# Patient Record
Sex: Female | Born: 2000 | Race: White | Hispanic: No | Marital: Single | Smoking: Former smoker
Health system: Southern US, Community
[De-identification: ages and names within clinical notes are randomized; demographics above are authoritative.]

## PROBLEM LIST (undated history)

## (undated) DIAGNOSIS — H9201 Otalgia, right ear: Secondary | ICD-10-CM

## (undated) DIAGNOSIS — Z872 Personal history of diseases of the skin and subcutaneous tissue: Secondary | ICD-10-CM

## (undated) DIAGNOSIS — Z8669 Personal history of other diseases of the nervous system and sense organs: Secondary | ICD-10-CM

## (undated) DIAGNOSIS — L2089 Other atopic dermatitis: Secondary | ICD-10-CM

## (undated) DIAGNOSIS — J45990 Exercise induced bronchospasm: Secondary | ICD-10-CM

## (undated) DIAGNOSIS — F332 Major depressive disorder, recurrent severe without psychotic features: Secondary | ICD-10-CM

## (undated) DIAGNOSIS — F938 Other childhood emotional disorders: Secondary | ICD-10-CM

## (undated) DIAGNOSIS — G479 Sleep disorder, unspecified: Secondary | ICD-10-CM

## (undated) DIAGNOSIS — E669 Obesity, unspecified: Secondary | ICD-10-CM

## (undated) DIAGNOSIS — R569 Unspecified convulsions: Secondary | ICD-10-CM

## (undated) DIAGNOSIS — J3081 Allergic rhinitis due to animal (cat) (dog) hair and dander: Secondary | ICD-10-CM

## (undated) DIAGNOSIS — F325 Major depressive disorder, single episode, in full remission: Secondary | ICD-10-CM

## (undated) DIAGNOSIS — Z30017 Encounter for initial prescription of implantable subdermal contraceptive: Secondary | ICD-10-CM

## (undated) DIAGNOSIS — Z91010 Allergy to peanuts: Secondary | ICD-10-CM

## (undated) DIAGNOSIS — Z8659 Personal history of other mental and behavioral disorders: Secondary | ICD-10-CM

## (undated) DIAGNOSIS — G40309 Generalized idiopathic epilepsy and epileptic syndromes, not intractable, without status epilepticus: Secondary | ICD-10-CM

## (undated) DIAGNOSIS — F17200 Nicotine dependence, unspecified, uncomplicated: Secondary | ICD-10-CM

## (undated) DIAGNOSIS — Z9109 Other allergy status, other than to drugs and biological substances: Secondary | ICD-10-CM

## (undated) DIAGNOSIS — K5900 Constipation, unspecified: Secondary | ICD-10-CM

## (undated) DIAGNOSIS — R39198 Other difficulties with micturition: Secondary | ICD-10-CM

## (undated) DIAGNOSIS — M41129 Adolescent idiopathic scoliosis, site unspecified: Secondary | ICD-10-CM

## (undated) DIAGNOSIS — F801 Expressive language disorder: Secondary | ICD-10-CM

## (undated) DIAGNOSIS — M549 Dorsalgia, unspecified: Secondary | ICD-10-CM

## (undated) DIAGNOSIS — J4530 Mild persistent asthma, uncomplicated: Secondary | ICD-10-CM

## (undated) DIAGNOSIS — Z91048 Other nonmedicinal substance allergy status: Secondary | ICD-10-CM

## (undated) DIAGNOSIS — L309 Dermatitis, unspecified: Secondary | ICD-10-CM

## (undated) DIAGNOSIS — L700 Acne vulgaris: Secondary | ICD-10-CM

## (undated) DIAGNOSIS — R55 Syncope and collapse: Secondary | ICD-10-CM

## (undated) DIAGNOSIS — J45909 Unspecified asthma, uncomplicated: Secondary | ICD-10-CM

## (undated) HISTORY — DX: Syncope and collapse: R55

## (undated) HISTORY — PX: NO PAST SURGERIES: SHX2092

## (undated) HISTORY — DX: Acne vulgaris: L70.0

## (undated) HISTORY — DX: Sleep disorder, unspecified: G47.9

## (undated) HISTORY — DX: Personal history of other mental and behavioral disorders: Z86.59

## (undated) HISTORY — DX: Nicotine dependence, unspecified, uncomplicated: F17.200

## (undated) HISTORY — DX: Allergic rhinitis due to animal (cat) (dog) hair and dander: J30.81

## (undated) HISTORY — DX: Other atopic dermatitis: L20.89

## (undated) HISTORY — DX: Other nonmedicinal substance allergy status: Z91.048

## (undated) HISTORY — DX: Major depressive disorder, recurrent severe without psychotic features: F33.2

## (undated) HISTORY — DX: Expressive language disorder: F80.1

## (undated) HISTORY — DX: Other difficulties with micturition: R39.198

## (undated) HISTORY — DX: Personal history of other diseases of the nervous system and sense organs: Z86.69

## (undated) HISTORY — DX: Dermatitis, unspecified: L30.9

## (undated) HISTORY — DX: Obesity, unspecified: E66.9

## (undated) HISTORY — DX: Adolescent idiopathic scoliosis, site unspecified: M41.129

## (undated) HISTORY — DX: Dorsalgia, unspecified: M54.9

## (undated) HISTORY — DX: Encounter for initial prescription of implantable subdermal contraceptive: Z30.017

## (undated) HISTORY — DX: Otalgia, right ear: H92.01

## (undated) HISTORY — DX: Exercise induced bronchospasm: J45.990

## (undated) HISTORY — DX: Constipation, unspecified: K59.00

## (undated) HISTORY — DX: Personal history of diseases of the skin and subcutaneous tissue: Z87.2

## (undated) HISTORY — DX: Unspecified asthma, uncomplicated: J45.909

## (undated) HISTORY — DX: Generalized idiopathic epilepsy and epileptic syndromes, not intractable, without status epilepticus: G40.309

## (undated) HISTORY — DX: Major depressive disorder, single episode, in full remission: F32.5

## (undated) HISTORY — DX: Mild persistent asthma, uncomplicated: J45.30

## (undated) HISTORY — DX: Unspecified convulsions: R56.9

## (undated) HISTORY — DX: Allergy to peanuts: Z91.010

## (undated) HISTORY — DX: Other allergy status, other than to drugs and biological substances: Z91.09

---
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
## 2019-03-11 ENCOUNTER — Other Ambulatory Visit: Payer: Self-pay

## 2019-03-11 ENCOUNTER — Ambulatory Visit (INDEPENDENT_AMBULATORY_CARE_PROVIDER_SITE_OTHER): Payer: Medicaid Other | Admitting: *Deleted

## 2019-03-11 DIAGNOSIS — Z23 Encounter for immunization: Secondary | ICD-10-CM

## 2019-03-15 ENCOUNTER — Encounter: Payer: Self-pay | Admitting: Family Medicine

## 2019-03-15 DIAGNOSIS — Z87891 Personal history of nicotine dependence: Secondary | ICD-10-CM | POA: Insufficient documentation

## 2019-03-16 ENCOUNTER — Encounter: Payer: Self-pay | Admitting: Family Medicine

## 2019-03-16 ENCOUNTER — Other Ambulatory Visit: Payer: Self-pay

## 2019-03-16 ENCOUNTER — Ambulatory Visit (INDEPENDENT_AMBULATORY_CARE_PROVIDER_SITE_OTHER): Payer: Medicaid Other | Admitting: Family Medicine

## 2019-03-16 VITALS — BP 120/80 | HR 55 | Wt 269.8 lb

## 2019-03-16 DIAGNOSIS — N946 Dysmenorrhea, unspecified: Secondary | ICD-10-CM

## 2019-03-16 DIAGNOSIS — R1110 Vomiting, unspecified: Secondary | ICD-10-CM | POA: Diagnosis not present

## 2019-03-16 DIAGNOSIS — N926 Irregular menstruation, unspecified: Secondary | ICD-10-CM | POA: Diagnosis not present

## 2019-03-16 LAB — POCT GLYCOSYLATED HEMOGLOBIN (HGB A1C): Hemoglobin A1C: 5.5 % (ref 4.0–5.6)

## 2019-03-16 LAB — POCT URINE PREGNANCY: Preg Test, Ur: NEGATIVE

## 2019-03-16 NOTE — Assessment & Plan Note (Signed)
U preg negative in the clinic today.  Suspect onset of vaginal bleeding with menstrual cramps earlier today is the start to her cycle with no additional red flags. Stopped Depo injections in 06/2019, may continue to see irregularity in her cycles up to 1 year. - Ibuprofen 400-600 mg every 4-6 hours for pain, bleeding - Discussed alternative contraception methods (not on anything currently), highly encouraged patient to consider her options and discuss further on follow-up - Recommended taking prenatal vitamins during this time - Return precautions discussed including persistent vaginal bleeding >1 week, lightheadedness/dizziness, severe abdominal pain etc.

## 2019-03-16 NOTE — Assessment & Plan Note (Addendum)
Incidentally noted during conversation. Reports > 3 month history of vomiting small amounts of yellow fluid every morning upon awakening, patient has attributed this to her IBS. Otherwise feels well.  Differential remains broad, such as gastroparesis, GERD, functional vomiting disorder, etc. Less likely cyclic vomiting given everyday nature and no history of MJ use. Recommend follow-up with her PCP to discuss this further. - Screening A1c obtained to rule out diabetic contribution given elevated BMI, 5.5 in office - Follow-up with PCP within the next 1-2 weeks

## 2019-03-16 NOTE — Patient Instructions (Signed)
It was so wonderful meeting you today.  I am so sorry that you are having a lot of belly cramping and bleeding.  It is likely this is your menstrual cycle started.  You can use ibuprofen 400 to 600 mg every 4-6 hours for the next week during this time, this will help with bleeding and cramping pain.  If your bleeding persists past this week, if you become lightheaded/dizzy/short of breath, if the cramping gets so severe that you cannot handle the pain or have associated inability to eat, vomiting, fever--you need to be seen.  I will let you know the results of your test when it returns.  Please follow-up as soon as you can to further discuss your daily vomiting and also further discuss options for birth control including IUD or Nexplanon etc.

## 2019-03-16 NOTE — Progress Notes (Signed)
Subjective:    Patient ID: Alice Gibson, female    DOB: 05/30/2001, 18 y.o.   MRN: [id number]  CC: uterus pain   HPI: Alice Gibson is a 18 year old female with a history of mild persistent asthma, atopic dermatitis, obesity, and chronic pain of left knee presenting discuss the following:  Dysmenorrhea: She presents nervous today because when she got up this morning to urinate she noticed vaginal bleeding. Then started to experience lower abdominal cramping more severe than her previous menstrual cycles.  LMP July 12th.  Did not check a pregnancy test at home during this time.  She previously was on Depo shots, stopped in December.  Not currently on any birth control, not using condoms.  Did not have a menstrual cycle until this May but had monthly cycles for June and July.  She took some ibuprofen 600 mg this morning, seems to help with the cramping.  In terms of bleeding, has already gone through 5 pads this morning semi-covered.  She is in a monogamous relationship, denies any concern for STDs.  Before this, she denies any recent dysuria, fever, vaginal discharge, vaginal itching/irritation, dyspareunia.  Otherwise has felt at baseline. Able to eat and drink as normal.  During conversation, patient also notes she has been vomiting small amounts of yellow fluid on a daily basis for the past several months.  She thought this was normal secondary to her IBS.  Says she wakes up every morning and vomits, not after eating.  Denies feeling nauseous or have any change in bowel movements.  No associated abdominal pains.  Has BMs frequently.  Denies any smoking, marijuana use, or regular alcohol use.   Review of Systems Per HPI, also denies recent illness, fever, headache, changes in vision, chest pain, shortness of breath, weakness   Patient Active Problem List   Diagnosis Date Noted  . Dysmenorrhea 03/16/2019  . Chronic vomiting 03/16/2019  . Quit smoking 03/15/2019  . Numbness and tingling of left  side of face 11/24/2018  . Possible IBS (irritable bowel syndrome) 05/29/2018  . Chest pain 05/29/2018  . Chronic pain of left knee 11/27/2017  . Atopic dermatitis 05/02/2016  . Exercise-induced asthma 05/02/2016  . Eczema 11/28/2015  . Morbid obesity (HCC) 10/27/2008  . Asthma, mild persistent 03/12/2007     Objective:  BP 120/80   Pulse (!) 55   Wt 269 lb 12.8 oz (122.4 kg)   SpO2 97%   BMI 50.98 kg/m  Vitals and nursing note reviewed  General: NAD, pleasant Cardiac: RRR, normal heart sounds, no murmurs Respiratory: CTAB, normal effort Abdomen: soft, tender in the left lower quadrant to palpation without any rebounding or guarding, normal active bowel sounds, nondistended, no hepatosplenomegaly noted Extremities: no edema or cyanosis. WWP. Skin: warm and dry, no rashes noted Neuro: alert and oriented, no focal deficits Psych: normal affect  Patient declined pelvic exam today.  Assessment & Plan:   Dysmenorrhea U preg negative in the clinic today.  Suspect onset of vaginal bleeding with menstrual cramps earlier today is the start to her cycle with no additional red flags. Stopped Depo injections in 06/2019, may continue to see irregularity in her cycles up to 1 year. - Ibuprofen 400-600 mg every 4-6 hours for pain, bleeding - Discussed alternative contraception methods (not on anything currently), highly encouraged patient to consider her options and discuss further on follow-up - Recommended taking prenatal vitamins during this time - Return precautions discussed including persistent vaginal bleeding >1 week, lightheadedness/dizziness, severe  abdominal pain etc.  Chronic vomiting Incidentally noted during conversation. Reports > 3 month history of vomiting small amounts of yellow fluid every morning upon awakening, patient has attributed this to her IBS. Otherwise feels well.  Differential remains broad, such as gastroparesis, GERD, functional vomiting disorder, etc. Less  likely cyclic vomiting given everyday nature and no history of MJ use. Recommend follow-up with her PCP to discuss this further. - Screening A1c obtained to rule out diabetic contribution given elevated BMI, 5.5 in office - Follow-up with PCP within the next 1-2 weeks   Follow-up in the next 1 to 2 weeks at convenience to discuss chronic emesis and contraception methods  [place] Resident PGY-2

## 2019-04-16 ENCOUNTER — Telehealth (INDEPENDENT_AMBULATORY_CARE_PROVIDER_SITE_OTHER): Payer: Medicaid Other | Admitting: Family Medicine

## 2019-04-16 ENCOUNTER — Telehealth: Payer: Medicaid Other | Admitting: Family Medicine

## 2019-04-16 ENCOUNTER — Other Ambulatory Visit: Payer: Self-pay

## 2019-04-16 DIAGNOSIS — Z20828 Contact with and (suspected) exposure to other viral communicable diseases: Secondary | ICD-10-CM

## 2019-04-16 DIAGNOSIS — Z20822 Contact with and (suspected) exposure to covid-19: Secondary | ICD-10-CM

## 2019-04-16 NOTE — Progress Notes (Signed)
[facility] Telemedicine Visit  Patient consented to have virtual visit. Method of visit: Telephone  Encounter participants: Patient: Alice Gibson - located at home Provider: Lyndee Hensen - located at home Others (if applicable):   Chief Complaint: COVID exposure  HPI: Alice Gibson is a 18 y.o. female presents after COVID exposure.  Jerah states that boyfriend's aunt did not have COVID symptoms however tested positive for COVID 19 today. [name] daughter was exposed as well. Mom told her employer that she was exposed to someone who was diagnosed with COVID 19 and was sent home from work.  Patient with hx of asthma however has not had an asthma flare in over a year.  Former smoker quit about 3 years ago.   ROS: per HPI  Pertinent PMHx: asthma, obesity  Exam:  Respiratory: Speaking in full sentences without pause.   Assessment/Plan: Lab  for COVID testing placed.  Patient advised of 3 locations for drive up testing including:  [facility] (old [facility] in [place]) [place]. [place], [place] [address]  [place] ([place]) [address] [place], [place] [address]  [address] (near [place] in [place]) [place], [place] [address]  Patient advised of hours 8am-4pm and that they should be in line by 3 PM.  -ED precautions discussed and patient expressed good understanding -Patient counseled on wearing a mask, washing hands -Patient instructed to avoid others until she meets criteria for ending isolation after any suspected COVID, which are:  -3 days with no fever and  -Respiratory symptoms have improved (e.g. cough, shortness of breath) or  -10 days since symptoms first appeared       Time spent during visit with patient: 13 minutes

## 2019-04-21 ENCOUNTER — Other Ambulatory Visit: Payer: Self-pay

## 2019-04-21 DIAGNOSIS — Z20822 Contact with and (suspected) exposure to covid-19: Secondary | ICD-10-CM

## 2019-04-22 LAB — NOVEL CORONAVIRUS, NAA: SARS-CoV-2, NAA: NOT DETECTED

## 2019-04-28 ENCOUNTER — Telehealth (INDEPENDENT_AMBULATORY_CARE_PROVIDER_SITE_OTHER): Payer: Medicaid Other | Admitting: Family Medicine

## 2019-04-28 ENCOUNTER — Other Ambulatory Visit: Payer: Self-pay

## 2019-04-28 DIAGNOSIS — L910 Hypertrophic scar: Secondary | ICD-10-CM

## 2019-04-28 NOTE — Progress Notes (Signed)
[facility] Telemedicine Visit  Patient consented to have virtual visit. Method of visit: Video  Encounter participants: Patient: Alice Gibson - located at home in [facility] Provider: Nuala Alpha - located at [facility] Others (if applicable): none  Chief Complaint: Keloid on ear that is getting bigger  HPI:  Alice Gibson is a 18y/o female with no pertinent past medical history of presents today for concern of a growth on her right top ear. It began after a peircing and has been getting bigger. It does not hurt, is not bleeding, and has no discharge or drainage. She is curious as to what can be done about it.  ROS: per HPI  Pertinent PMHx: None  Exam:  Gen:  Respiratory: Speaks in full sentences Skin: Large keloid appearing mass on the top of the right ear  Assessment/Plan:  Keloid Patient has keloid formation after piercing.  - Referral to derm for possible steroid injection or surgery to remove    Time spent during visit with patient: >20 minutes  Harolyn Rutherford, DO [place], PGY-3

## 2019-05-06 DIAGNOSIS — L910 Hypertrophic scar: Secondary | ICD-10-CM | POA: Insufficient documentation

## 2019-05-06 NOTE — Assessment & Plan Note (Signed)
Patient has keloid formation after piercing.  - Referral to derm for possible steroid injection or surgery to remove

## 2019-06-07 ENCOUNTER — Other Ambulatory Visit: Payer: Self-pay

## 2019-06-07 ENCOUNTER — Encounter: Payer: Self-pay | Admitting: Family Medicine

## 2019-06-07 ENCOUNTER — Ambulatory Visit (INDEPENDENT_AMBULATORY_CARE_PROVIDER_SITE_OTHER): Payer: Medicaid Other | Admitting: Family Medicine

## 2019-06-07 VITALS — BP 120/80 | HR 90 | Wt 249.0 lb

## 2019-06-07 DIAGNOSIS — K580 Irritable bowel syndrome with diarrhea: Secondary | ICD-10-CM

## 2019-06-07 DIAGNOSIS — R1110 Vomiting, unspecified: Secondary | ICD-10-CM | POA: Diagnosis not present

## 2019-06-07 DIAGNOSIS — Z3202 Encounter for pregnancy test, result negative: Secondary | ICD-10-CM

## 2019-06-07 LAB — POCT URINE PREGNANCY: Preg Test, Ur: NEGATIVE

## 2019-06-10 DIAGNOSIS — Z3202 Encounter for pregnancy test, result negative: Secondary | ICD-10-CM | POA: Insufficient documentation

## 2019-06-10 NOTE — Assessment & Plan Note (Signed)
Patient with recent history of using Depo as a hormonal birth control.  Since she came off of the Depo she had been using condoms as her sole birth control method.  She said that her.  Has been regular every month since then and their first day last menstrual period was second or 3 October.  She said that she did not get her menstrual period the beginning of November and is concerned that she might be pregnant.  She says she is consistent with using the condoms with her sole sexual partner and that she is safe and then once making her do anything she does not want to do.  Discussed negative pregnancy test result with patient and advised hormonal birth control which patient has refused at this time and says that she intends to continue with condoms.

## 2019-06-10 NOTE — Progress Notes (Signed)
Subjective:  Alice Gibson is a 18 y.o. female who presents to the [facility] today with a chief complaint of wanting pregnancy test and vomiting/diarrhea.   HPI: Pregnancy examination or test, negative result Patient with recent history of using Depo as a hormonal birth control.  Since she came off of the Depo she had been using condoms as her sole birth control method.  She said that her.  Has been regular every month since then and their first day last menstrual period was second or 3 October.  She said that she did not get her menstrual period the beginning of November and is concerned that she might be pregnant.  She says she is consistent with using the condoms with her sole sexual partner and that she is safe and then once making her do anything she does not want to do.  Chronic vomiting Patient describes vomiting 2-3 times a week.  She says she really feels significantly ill and has not been having any fever or symptoms like that and that after she vomits she tends to feel fine.  She denies any blood.    Patient does states she is otherwise able to hold down food and has not been losing weight.  Is not toxic appearing  Possible IBS (irritable bowel syndrome) Patient says that she has significant bouts of diarrhea approximately 2-4 times per week.  She is unable to identify any specific food trigger and denies any sense of constipation or blood in her stool.  Says she thinks this is recurring IBS that she says she has been diagnosed with in the past.  She denies any blood.   Patient does states she is otherwise able to hold down food and has not been losing weight.  Is not toxic appearing  Objective:  Physical Exam: BP 120/80   Pulse 90   Wt 249 lb (112.9 kg)   SpO2 99%   BMI 47.05 kg/m   Gen: NAD, conversing comfortably, pleasant CV: Regular heart rate Pulm: NWOB, no cough GI: Soft, Nontender, Nondistended. MSK: no edema, cyanosis, or clubbing noted Skin: warm, dry Neuro: grossly  normal, moves all extremities Psych: Normal affect and thought content  Results for orders placed or performed in visit on 06/07/19 (from the past 72 hour(s))  POCT urine pregnancy     Status: None   Collection Time: 06/07/19  3:18 PM  Result Value Ref Range   Preg Test, Ur Negative Negative     Assessment/Plan:  Pregnancy examination or test, negative result Patient with recent history of using Depo as a hormonal birth control.  Since she came off of the Depo she had been using condoms as her sole birth control method.  She said that her.  Has been regular every month since then and their first day last menstrual period was second or 3 October.  She said that she did not get her menstrual period the beginning of November and is concerned that she might be pregnant.  She says she is consistent with using the condoms with her sole sexual partner and that she is safe and then once making her do anything she does not want to do.  Discussed negative pregnancy test result with patient and advised hormonal birth control which patient has refused at this time and says that she intends to continue with condoms.  Chronic vomiting Patient describes vomiting 2-3 times a week.  She says she really feels significantly ill and has not been having any fever  or symptoms like that and that after she vomits she tends to feel fine.  She denies any blood.  Given prior diagnosis of possible IBS along with new complaint of vomiting over the last month will refer to GI.  Patient does states she is otherwise able to hold down food and has not been losing weight.  Is not toxic appearing  Possible IBS (irritable bowel syndrome) Patient says that she has significant bouts of diarrhea approximately 2-4 times per week.  She is unable to identify any specific food trigger and denies any sense of constipation or blood in her stool.  Says she thinks this is recurring IBS that she says she has been diagnosed with in the past.   Patient describes vomiting 2-3 times a week.  She says she really feels significantly ill and has not been having any fever or symptoms like that and that after she vomits she tends to feel fine.  She denies any blood.  Given prior diagnosis of possible IBS along with new complaint of vomiting over the last month will refer to GI.  Patient does states she is otherwise able to hold down food and has not been losing weight.  Is not toxic appearing   Marthenia Rolling, DO FAMILY MEDICINE RESIDENT - PGY3 06/10/2019 2:44 PM

## 2019-06-10 NOTE — Assessment & Plan Note (Addendum)
Patient says that she has significant bouts of diarrhea approximately 2-4 times per week.  She is unable to identify any specific food trigger and denies any sense of constipation or blood in her stool.  Says she thinks this is recurring IBS that she says she has been diagnosed with in the past.  Patient describes vomiting 2-3 times a week.  She says she really feels significantly ill and has not been having any fever or symptoms like that and that after she vomits she tends to feel fine.  She denies any blood.  Given prior diagnosis of possible IBS along with new complaint of vomiting over the last month will refer to GI.  Patient does states she is otherwise able to hold down food and has not been losing weight.  Is not toxic appearing

## 2019-06-10 NOTE — Assessment & Plan Note (Signed)
Patient describes vomiting 2-3 times a week.  She says she really feels significantly ill and has not been having any fever or symptoms like that and that after she vomits she tends to feel fine.  She denies any blood.  Given prior diagnosis of possible IBS along with new complaint of vomiting over the last month will refer to GI.  Patient does states she is otherwise able to hold down food and has not been losing weight.  Is not toxic appearing

## 2019-06-15 ENCOUNTER — Encounter: Payer: Self-pay | Admitting: Gastroenterology

## 2019-06-23 ENCOUNTER — Other Ambulatory Visit (INDEPENDENT_AMBULATORY_CARE_PROVIDER_SITE_OTHER): Payer: Medicaid Other

## 2019-06-23 ENCOUNTER — Ambulatory Visit (INDEPENDENT_AMBULATORY_CARE_PROVIDER_SITE_OTHER): Payer: Medicaid Other | Admitting: Gastroenterology

## 2019-06-23 ENCOUNTER — Other Ambulatory Visit: Payer: Self-pay

## 2019-06-23 ENCOUNTER — Encounter: Payer: Self-pay | Admitting: Gastroenterology

## 2019-06-23 VITALS — BP 118/70 | HR 52 | Temp 97.1°F | Ht 59.0 in | Wt 246.5 lb

## 2019-06-23 DIAGNOSIS — Z1159 Encounter for screening for other viral diseases: Secondary | ICD-10-CM

## 2019-06-23 DIAGNOSIS — R1110 Vomiting, unspecified: Secondary | ICD-10-CM

## 2019-06-23 DIAGNOSIS — R1033 Periumbilical pain: Secondary | ICD-10-CM

## 2019-06-23 DIAGNOSIS — R197 Diarrhea, unspecified: Secondary | ICD-10-CM

## 2019-06-23 LAB — COMPREHENSIVE METABOLIC PANEL
ALT: 27 U/L (ref 0–35)
AST: 18 U/L (ref 0–37)
Albumin: 4.6 g/dL (ref 3.5–5.2)
Alkaline Phosphatase: 76 U/L (ref 47–119)
BUN: 12 mg/dL (ref 6–23)
CO2: 27 mEq/L (ref 19–32)
Calcium: 9.5 mg/dL (ref 8.4–10.5)
Chloride: 103 mEq/L (ref 96–112)
Creatinine, Ser: 0.69 mg/dL (ref 0.40–1.20)
GFR: 109.28 mL/min (ref >60.00)
Glucose, Bld: 101 mg/dL — ABNORMAL HIGH (ref 70–99)
Potassium: 4.1 mEq/L (ref 3.5–5.1)
Sodium: 138 mEq/L (ref 135–145)
Total Bilirubin: 0.4 mg/dL (ref 0.2–1.2)
Total Protein: 7.8 g/dL (ref 6.0–8.3)

## 2019-06-23 LAB — CBC WITH DIFFERENTIAL/PLATELET
Basophils Absolute: 0.1 10*3/uL (ref 0.0–0.1)
Basophils Relative: 0.7 % (ref 0.0–3.0)
Eosinophils Absolute: 0.1 10*3/uL (ref 0.0–0.7)
Eosinophils Relative: 0.7 % (ref 0.0–5.0)
HCT: 43.9 % (ref 36.0–49.0)
Hemoglobin: 14.8 g/dL (ref 12.0–16.0)
Lymphocytes Relative: 30.3 % (ref 24.0–48.0)
Lymphs Abs: 2.9 10*3/uL (ref 0.7–4.0)
MCHC: 33.6 g/dL (ref 31.0–37.0)
MCV: 88.5 fl (ref 78.0–98.0)
Monocytes Absolute: 0.6 10*3/uL (ref 0.1–1.0)
Monocytes Relative: 5.9 % (ref 3.0–12.0)
Neutro Abs: 6 10*3/uL (ref 1.4–7.7)
Neutrophils Relative %: 62.4 % (ref 43.0–71.0)
Platelets: 330 10*3/uL (ref 150.0–575.0)
RBC: 4.96 Mil/uL (ref 3.80–5.70)
RDW: 13.2 % (ref 11.4–15.5)
WBC: 9.6 10*3/uL (ref 4.5–13.5)

## 2019-06-23 LAB — C-REACTIVE PROTEIN: CRP: 1.1 mg/dL (ref 0.5–20.0)

## 2019-06-23 LAB — SEDIMENTATION RATE: Sed Rate: 20 mm/hr (ref 0–20)

## 2019-06-23 LAB — IGA: IgA: 157 mg/dL (ref 68–378)

## 2019-06-23 LAB — TSH: TSH: 1.08 u[IU]/mL (ref 0.40–5.00)

## 2019-06-23 MED ORDER — PANTOPRAZOLE SODIUM 40 MG PO TBEC: 40.0000 mg | DELAYED_RELEASE_TABLET | Freq: Every day | ORAL | 11 refills | Status: DC

## 2019-06-23 MED ORDER — NA SULFATE-K SULFATE-MG SULF 17.5-3.13-1.6 GM/177ML PO SOLN: 1.0000 | Freq: Once | ORAL | 0 refills | Status: AC

## 2019-06-23 MED ORDER — GLYCOPYRROLATE 2 MG PO TABS: 2.0000 mg | ORAL_TABLET | Freq: Two times a day (BID) | ORAL | 11 refills | Status: DC

## 2019-06-23 NOTE — Progress Notes (Signed)
History of Present Illness: This is a 18 year old female referred by McDiarmid, Blane Ohara, MD for the evaluation of vomiting and diarrhea. Previously diagnosed with IBS.  She relates difficulties with constipation and lower abdominal pain about 5 -7  years ago and MiraLAX was helpful for relief of symptoms.  Over the past few years she has had more frequent problems with diarrhea associated with lower abdominal pain.  Her diarrhea is urgent.  She has very infrequent constipation.  Diarrhea and lower abdominal pain has gradually worsened.  She is found that red meat and spicy foods tend to exacerbate symptoms.  She is uncertain of any other dietary stressors.  She relates frequent problems with morning vomiting occurring about twice per week.  She has occasional dyspeptic symptoms.  She often notes partially digested or undigested food from hours prior in her emesis.  She states she was treated with famotidine in the past with improvement in symptoms. she is trying to lose weight and has lost several pounds intentionally  Denies change in stool caliber, melena, hematochezia, dysphagia, chest pain.    Allergies  Allergen Reactions  . Valproic Acid Other (See Comments)    REACTION: essential tremor   Outpatient Medications Prior to Visit  Medication Sig Dispense Refill  . diclofenac sodium (VOLTAREN) 1 % GEL Apply to knees when aching (Patient not taking: Reported on 06/23/2019) 100 g 2  . ibuprofen (ADVIL,MOTRIN) 400 MG tablet Take 1.5 tablets (600 mg total) by mouth every 6 (six) hours. (Patient not taking: Reported on 06/23/2019) 30 tablet 0   No facility-administered medications prior to visit.    Past Medical History:  Diagnosis Date  . Abnormal urination 04/26/2014  . Allergic rhinitis due to cats 05/02/2016  . Allergic to animal dander 05/02/2016   Equivocal reactivity to Cat and to Dog danders on skin-prick testing by Dr Ishmael Holter ([place], 01/2008)  . Allergy to mold 05/02/2016   Equivocal reactivity to mold on skin-prick testing by Dr Ishmael Holter ([facility], 01/2008)  . Allergy to pollen 05/02/2016   Strong allergic response to skin-prick testing ( Dr Ishmael Holter ([facility]) 01/2008) to tree pollens  . 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 Asthma   . Asthma, mild persistent 03/12/2007   Qualifier: Diagnosis of  By: Zebedee Iba NP, Manuela Schwartz     . ASTHMA, PERSISTENT 03/12/2007   Qualifier: Diagnosis of  By: Zebedee Iba NP, Manuela Schwartz     . Back pain 05/18/2013  . CHILDHOOD OBESITY 10/27/2008   Qualifier: Diagnosis of  By: McDiarmid MD, Sherren Mocha    . Constipation 10/05/2013  . DERMATITIS, ATOPIC 12/18/2007   Qualifier: Diagnosis of  By: McDiarmid MD, Sherren Mocha    . Disordered sleep 12/02/2016  . Disordered sleep 12/02/2016  . Eczema 11/28/2015  . Exercise-induced asthma 05/02/2016  . Expressive language disorder 07/30/2010   Qualifier: Diagnosis of  By: McDiarmid MD, Sherren Mocha    . 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 History of atopic dermatitis 12/18/2007   History of atopic dermatitis as child.     Marland Kitchen History of atopic dermatitis 12/18/2007   History of atopic dermatitis as child.     Marland Kitchen History of major depression 04/27/2015  . 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 Major depressive disorder in remission ([place]) 07/21/2015  . Major depressive disorder in remission ([place]) 07/21/2015  .  MDD (major depressive disorder), recurrent episode, severe ([place]) 04/27/2015  . Obesity 10/27/2008   Qualifier: Diagnosis of  By: McDiarmid MD, Sherren Mocha    . Otalgia of right ear 07/21/2015  . Peanut allergy 10/05/2013  . Scoliosis, adolescent acquired 09/11/2015  . SEIZURE DISORDER, HX OF 04/02/2007   Qualifier: Diagnosis of  By: McDiarmid MD, Sherren Mocha    . Seizures ([place])   . Severe episode of recurrent major depressive disorder, without psychotic features ([place])   . Smoking 07/21/2015  . Superficial acne vulgaris  05/02/2016  . Syncope 07/21/2015   Past Surgical History:  Procedure Laterality Date  . NO PAST SURGERIES     Social History   Socioeconomic History  . Marital status: Single    Spouse name: Not on file  . Number of children: Not on file  . Years of education: Not on file  . Highest education level: Not on file  Occupational History  . Not on file  Social Needs  . Financial resource strain: Not on file  . Food insecurity    Worry: Not on file    Inability: Not on file  . Transportation needs    Medical: Not on file    Non-medical: Not on file  Tobacco Use  . Smoking status: Former Smoker    Types: Cigarettes  . Smokeless tobacco: Never Used  Substance and Sexual Activity  . Alcohol use: Yes    Comment: occassionally  . Drug use: Not Currently    Types: Marijuana  . Sexual activity: Never  Lifestyle  . Physical activity    Days per week: Not on file    Minutes per session: Not on file  . Stress: Not on file  Relationships  . Social Herbalist on phone: Not on file    Gets together: Not on file    Attends religious service: Not on file    Active member of club or organization: Not on file    Attends meetings of clubs or organizations: Not on file    Relationship status: Not on file  Other Topics Concern  . Not on file  Social History Narrative    Temporary court-appointed guardians, patient's sister, Nira Conn, and patient's brother-in-law, Paediatric nurse.   Mother with severe medical and mental illnesses that have placed great stress on patient as child and adolescent.    Yarah has been sexually active      Family History  Problem Relation Age of Onset  . Alcohol abuse Mother 18  . Drug abuse Mother 66  . Mental illness Mother 25  . Clotting disorder Mother 61       Hypercoagulopathy undefined but causing large aortic thromboemboli  . Kidney disease Father   . Diabetes Maternal Grandmother   . Colon cancer Neg Hx   . Esophageal cancer Neg Hx   .  Pancreatic cancer Neg Hx   . Stomach cancer Neg Hx         Review of Systems: Pertinent positive and negative review of systems were noted in the above HPI section. All other review of systems were otherwise negative.   Physical Exam: General: Well developed, well nourished, no acute distress Head: Normocephalic and atraumatic Eyes:  sclerae anicteric, EOMI Ears: Normal auditory acuity Mouth: No deformity or lesions Neck: Supple, no masses or thyromegaly Lungs: Clear throughout to auscultation Heart: Regular rate and rhythm; no murmurs, rubs or bruits Abdomen: Soft, mild lower abdominal tenderness and non distended. No masses, hepatosplenomegaly or hernias  noted. Normal Bowel sounds Rectal: deferred to colonoscopy Musculoskeletal: Symmetrical with no gross deformities  Skin: No lesions on visible extremities Pulses:  Normal pulses noted Extremities: No clubbing, cyanosis, edema or deformities noted Neurological: Alert oriented x 4, grossly nonfocal Cervical Nodes:  No significant cervical adenopathy Inguinal Nodes: No significant inguinal adenopathy Psychological:  Alert and cooperative. Normal mood and affect   Assessment and Recommendations:  1. Episodic vomiting, generally in the morning. R/O GERD, esophagitis, ulcer, gastroparesis.  Follow standard antireflux measures.  Begin pantoprazole 40 mg p.o. every morning.  Schedule EGD. The risks (including bleeding, perforation, infection, missed lesions, medication reactions and possible hospitalization or surgery if complications occur), benefits, and alternatives to endoscopy with possible biopsy and possible dilation were discussed with the patient and they consent to proceed.   2. Frequent diarrhea associated with periumbilical abdominal pain.  Suspected IBS-D.  Rule out IBD and other disorders.  CBC, CMP, TSH, tTG, IgA, CRP, ESR.  Avoid foods that trigger symptoms.  7-day lactose-free diet and assess response.  Begin  glycopyrrolate 2 mg p.o. twice daily.  Schedule colonoscopy. The risks (including bleeding, perforation, infection, missed lesions, medication reactions and possible hospitalization or surgery if complications occur), benefits, and alternatives to colonoscopy with possible biopsy and possible polypectomy were discussed with the patient and they consent to proceed.     cc: McDiarmid, Blane Ohara, MD [address] [place],  [place] [address]

## 2019-06-23 NOTE — Patient Instructions (Signed)
Your provider has requested that you go to the basement level for lab work before leaving today. Press "B" on the elevator. The lab is located at the first door on the left as you exit the elevator.  We have sent the following medications to your pharmacy for you to pick up at your convenience: protonix and glycopyrrolate.   Patient advised to avoid spicy, acidic, citrus, chocolate, mints, fruit and fruit juices.  Limit the intake of caffeine, alcohol and Soda.  Don't exercise too soon after eating.  Don't lie down within 3-4 hours of eating.  Elevate the head of your bed.  You have been scheduled for an endoscopy and colonoscopy. Please follow the written instructions given to you at your visit today. Please pick up your prep supplies at the pharmacy within the next 1-3 days. If you use inhalers (even only as needed), please bring them with you on the day of your procedure.   Thank you for choosing me and [facility].  Pricilla Riffle. Dagoberto Ligas., MD., Mar[facility] Regal

## 2019-06-24 LAB — TISSUE TRANSGLUTAMINASE, IGA: (tTG) Ab, IgA: 1 U/mL

## 2019-07-21 ENCOUNTER — Telehealth: Payer: Self-pay | Admitting: Gastroenterology

## 2019-07-21 NOTE — Telephone Encounter (Signed)
I called and spoke with the patient. She was at a Dr. Alfonzo Beers for her daughter.  I advised her I will call her back after 4

## 2019-07-21 NOTE — Telephone Encounter (Signed)
Left message for patient to call back  

## 2019-07-22 NOTE — Telephone Encounter (Signed)
Patient has decided to proceed with both procedures.  She has been rescheduled to 08/11/19

## 2019-07-22 NOTE — Telephone Encounter (Signed)
Left message for patient to call back  

## 2019-07-27 ENCOUNTER — Encounter: Payer: Medicaid Other | Admitting: Gastroenterology

## 2019-08-09 ENCOUNTER — Other Ambulatory Visit: Payer: Self-pay | Admitting: Gastroenterology

## 2019-08-09 ENCOUNTER — Ambulatory Visit (INDEPENDENT_AMBULATORY_CARE_PROVIDER_SITE_OTHER): Payer: Medicaid Other

## 2019-08-09 DIAGNOSIS — Z1159 Encounter for screening for other viral diseases: Secondary | ICD-10-CM

## 2019-08-09 LAB — SARS CORONAVIRUS 2 (TAT 6-24 HRS): SARS Coronavirus 2: NEGATIVE

## 2019-08-11 ENCOUNTER — Ambulatory Visit (AMBULATORY_SURGERY_CENTER): Payer: Medicaid Other | Admitting: Gastroenterology

## 2019-08-11 ENCOUNTER — Encounter: Payer: Self-pay | Admitting: Gastroenterology

## 2019-08-11 ENCOUNTER — Other Ambulatory Visit: Payer: Self-pay

## 2019-08-11 VITALS — BP 115/63 | HR 63 | Temp 97.1°F | Resp 22 | Ht 59.0 in | Wt 246.0 lb

## 2019-08-11 DIAGNOSIS — R197 Diarrhea, unspecified: Secondary | ICD-10-CM

## 2019-08-11 DIAGNOSIS — R1033 Periumbilical pain: Secondary | ICD-10-CM | POA: Diagnosis not present

## 2019-08-11 DIAGNOSIS — K319 Disease of stomach and duodenum, unspecified: Secondary | ICD-10-CM

## 2019-08-11 DIAGNOSIS — K529 Noninfective gastroenteritis and colitis, unspecified: Secondary | ICD-10-CM

## 2019-08-11 DIAGNOSIS — R1110 Vomiting, unspecified: Secondary | ICD-10-CM | POA: Diagnosis not present

## 2019-08-11 MED ORDER — SODIUM CHLORIDE 0.9 % IV SOLN: 500.0000 mL | Freq: Once | INTRAVENOUS | Status: DC

## 2019-08-11 NOTE — Op Note (Addendum)
[facility] Patient Name: Alice Gibson Procedure Date: 08/11/2019 11:09 AM MRN: [id number] Endoscopist: Ladene Artist , MD Age: 19 Referring MD:  Date of Birth: October 25, 2000 Gender: Female Account #: [id number] Procedure:                Upper GI endoscopy Indications:              Periumbilical abdominal pain, Persistent vomiting Medicines:                Monitored Anesthesia Care Procedure:                Pre-Anesthesia Assessment:                           - Prior to the procedure, a History and Physical                            was performed, and patient medications and                            allergies were reviewed. The patient's tolerance of                            previous anesthesia was also reviewed. The risks                            and benefits of the procedure and the sedation                            options and risks were discussed with the patient.                            All questions were answered, and informed consent                            was obtained. Prior Anticoagulants: The patient has                            taken no previous anticoagulant or antiplatelet                            agents. ASA Grade Assessment: II - A patient with                            mild systemic disease. After reviewing the risks                            and benefits, the patient was deemed in                            satisfactory condition to undergo the procedure.                           After obtaining informed consent, the endoscope was  passed under direct vision. Throughout the                            procedure, the patient's blood pressure, pulse, and                            oxygen saturations were monitored continuously. The                            Endoscope was introduced through the mouth, and                            advanced to the second part of duodenum. The upper                            GI  endoscopy was accomplished without difficulty.                            The patient tolerated the procedure well. Scope In: Scope Out: Findings:                 A single area of ectopic gastric mucosa was found                            in the proximal esophagus.                           The examined esophagus was otherwise normal.                           Patchy mildly erythematous mucosa without bleeding                            was found in the entire examined stomach. Biopsies                            were taken with a cold forceps for histology.                           The exam of the stomach was otherwise normal.                           The duodenal bulb and second portion of the                            duodenum were normal. Complications:            No immediate complications. Estimated Blood Loss:     Estimated blood loss was minimal. Impression:               - Esophageal inlet patch.                           - Otherwise normal esophagus.                           -  Erythematous mucosa in the stomach. Biopsied.                           - Normal duodenal bulb and second portion of the                            duodenum. Recommendation:           - Patient has a contact number available for                            emergencies. The signs and symptoms of potential                            delayed complications were discussed with the                            patient. Return to normal activities tomorrow.                            Written discharge instructions were provided to the                            patient.                           - Resume previous diet.                           - Continue present medications.                           - Await pathology results. Meryl Dare, MD 08/11/2019 11:41:27 AM This report has been signed electronically.

## 2019-08-11 NOTE — Progress Notes (Signed)
Called to room to assist during endoscopic procedure.  Patient ID and intended procedure confirmed with present staff. Received instructions for my participation in the procedure from the performing physician.  

## 2019-08-11 NOTE — Patient Instructions (Signed)
YOU HAD AN ENDOSCOPIC PROCEDURE TODAY AT THE [facility]:   Refer to the procedure report that was given to you for any specific questions about what was found during the examination.  If the procedure report does not answer your questions, please call your gastroenterologist to clarify.  If you requested that your care partner not be given the details of your procedure findings, then the procedure report has been included in a sealed envelope for you to review at your convenience later.  YOU SHOULD EXPECT: Some feelings of bloating in the abdomen. Passage of more gas than usual.  Walking can help get rid of the air that was put into your GI tract during the procedure and reduce the bloating. If you had a lower endoscopy (such as a colonoscopy or flexible sigmoidoscopy) you may notice spotting of blood in your stool or on the toilet paper. If you underwent a bowel prep for your procedure, you may not have a normal bowel movement for a few days.  Please Note:  You might notice some irritation and congestion in your nose or some drainage.  This is from the oxygen used during your procedure.  There is no need for concern and it should clear up in a day or so.  SYMPTOMS TO REPORT IMMEDIATELY:   Following lower endoscopy (colonoscopy or flexible sigmoidoscopy):  Excessive amounts of blood in the stool  Significant tenderness or worsening of abdominal pains  Swelling of the abdomen that is new, acute  Fever of 100F or higher   Following upper endoscopy (EGD)  Vomiting of blood or coffee ground material  New chest pain or pain under the shoulder blades  Painful or persistently difficult swallowing  New shortness of breath  Fever of 100F or higher  Black, tarry-looking stools  For urgent or emergent issues, a gastroenterologist can be reached at any hour by calling [phone].   DIET:  We do recommend a small meal at first, but then you may proceed to your regular diet.  Drink  plenty of fluids but you should avoid alcoholic beverages for 24 hours.  ACTIVITY:  You should plan to take it easy for the rest of today and you should NOT DRIVE or use heavy machinery until tomorrow (because of the sedation medicines used during the test).    FOLLOW UP: Our staff will call the number listed on your records 48-72 hours following your procedure to check on you and address any questions or concerns that you may have regarding the information given to you following your procedure. If we do not reach you, we will leave a message.  We will attempt to reach you two times.  During this call, we will ask if you have developed any symptoms of COVID 19. If you develop any symptoms (ie: fever, flu-like symptoms, shortness of breath, cough etc.) before then, please call [phone].  If you test positive for Covid 19 in the 2 weeks post procedure, please call and report this information to us.    If any biopsies were taken you will be contacted by phone or by letter within the next 1-3 weeks.  Please call us at [phone] if you have not heard about the biopsies in 3 weeks.    SIGNATURES/CONFIDENTIALITY: You and/or your care partner have signed paperwork which will be entered into your electronic medical record.  These signatures attest to the fact that that the information above on your After Visit Summary has been reviewed and is   understood.  Full responsibility of the confidentiality of this discharge information lies with you and/or your care-partner. 

## 2019-08-11 NOTE — Progress Notes (Signed)
PT taken to PACU. Monitors in place. VSS. Report given to RN. 

## 2019-08-11 NOTE — Op Note (Signed)
[facility] Patient Name: Alice Gibson Procedure Date: 08/11/2019 11:09 AM MRN: [id number] Endoscopist: Meryl Dare , MD Age: 19 Referring MD:  Date of Birth: 01/18/2001 Gender: Female Account #: [id number] Procedure:                Colonoscopy Indications:              Periumbilical abdominal pain, Clinically                            significant diarrhea of unexplained origin Medicines:                Monitored Anesthesia Care Procedure:                Pre-Anesthesia Assessment:                           - Prior to the procedure, a History and Physical                            was performed, and patient medications and                            allergies were reviewed. The patient's tolerance of                            previous anesthesia was also reviewed. The risks                            and benefits of the procedure and the sedation                            options and risks were discussed with the patient.                            All questions were answered, and informed consent                            was obtained. Prior Anticoagulants: The patient has                            taken no previous anticoagulant or antiplatelet                            agents. ASA Grade Assessment: II - A patient with                            mild systemic disease. After reviewing the risks                            and benefits, the patient was deemed in                            satisfactory condition to undergo the procedure.  After obtaining informed consent, the colonoscope                            was passed under direct vision. Throughout the                            procedure, the patient's blood pressure, pulse, and                            oxygen saturations were monitored continuously. The                            Colonoscope was introduced through the anus and                            advanced to the the  terminal ileum, with                            identification of the appendiceal orifice and IC                            valve. The terminal ileum, ileocecal valve,                            appendiceal orifice, and rectum were photographed.                            The quality of the bowel preparation was good. The                            colonoscopy was performed without difficulty. The                            patient tolerated the procedure well. Scope In: 11:16:13 AM Scope Out: 11:28:05 AM Scope Withdrawal Time: 0 hours 10 minutes 30 seconds  Total Procedure Duration: 0 hours 11 minutes 52 seconds  Findings:                 The perianal and digital rectal examinations were                            normal.                           The entire examined colon appeared normal on direct                            and retroflexion views. Biopsies for histology were                            taken with a cold forceps from the entire colon for                            evaluation of microscopic colitis.  The terminal ileum appeared normal. Biopsies were                            taken with a cold forceps for histology. Complications:            No immediate complications. Estimated blood loss:                            None. Estimated Blood Loss:     Estimated blood loss: none. Impression:               - The entire examined colon is normal on direct and                            retroflexion views. Biopsied.                           - The examined portion of the ileum was normal.                            Biopsied. Recommendation:           - Patient has a contact number available for                            emergencies. The signs and symptoms of potential                            delayed complications were discussed with the                            patient. Return to normal activities tomorrow.                            Written  discharge instructions were provided to the                            patient.                           - Resume previous diet.                           - Continue present medications.                           - Await pathology results. Meryl Dare, MD 08/11/2019 11:37:43 AM This report has been signed electronically.

## 2019-08-13 ENCOUNTER — Telehealth: Payer: Self-pay

## 2019-08-13 NOTE — Telephone Encounter (Signed)
  Follow up Call-  Call back number 08/11/2019  Post procedure Call Back phone  # [phone]  Permission to leave phone message Yes  Some recent data might be hidden     Patient questions:  Do you have a fever, pain , or abdominal swelling? No. Pain Score  0 *  Have you tolerated food without any problems? Yes.    Have you been able to return to your normal activities? Yes.    Do you have any questions about your discharge instructions: Diet   No. Medications  No. Follow up visit  No.  Do you have questions or concerns about your Care? No.  Actions: * If pain score is 4 or above: No action needed, pain <4.  1. Have you developed a fever since your procedure? no  2.   Have you had an respiratory symptoms (SOB or cough) since your procedure? no  3.   Have you tested positive for COVID 19 since your procedure no  4.   Have you had any family members/close contacts diagnosed with the COVID 19 since your procedure?  no   If yes to any of these questions please route to Laverna Peace, RN and Jennye Boroughs, Charity fundraiser.

## 2019-08-16 ENCOUNTER — Encounter: Payer: Self-pay | Admitting: Gastroenterology

## 2019-10-11 ENCOUNTER — Telehealth (INDEPENDENT_AMBULATORY_CARE_PROVIDER_SITE_OTHER): Payer: Medicaid Other | Admitting: Family Medicine

## 2019-10-11 ENCOUNTER — Encounter: Payer: Self-pay | Admitting: Family Medicine

## 2019-10-11 ENCOUNTER — Ambulatory Visit: Payer: Medicaid Other

## 2019-10-11 DIAGNOSIS — A084 Viral intestinal infection, unspecified: Secondary | ICD-10-CM | POA: Diagnosis not present

## 2019-10-11 MED ORDER — DICYCLOMINE HCL 10 MG PO CAPS: 10.0000 mg | ORAL_CAPSULE | Freq: Three times a day (TID) | ORAL | 0 refills | Status: DC

## 2019-10-11 MED ORDER — ONDANSETRON 4 MG PO TBDP: 4.0000 mg | ORAL_TABLET | Freq: Three times a day (TID) | ORAL | 0 refills | Status: DC | PRN

## 2019-10-11 NOTE — Assessment & Plan Note (Signed)
Likely viral illness overlying chronic IBS.  I am reassured that patient can tolerate fluids and is eating today.  Sent Zofran and Bentyl for nausea and abdominal cramping respectively.  Encouraged frequent hydration and monitoring urine output.  Return precautions given.

## 2019-10-11 NOTE — Progress Notes (Signed)
[facility] Telemedicine Visit  Patient consented to have virtual visit. Method of visit: Video  Encounter participants: Patient: Alice Gibson - located at home Provider: Lennox Solders - located at [facility] Others (if applicable): none  Chief Complaint: diarrhea, vomiting, abdominal pain  HPI:  Started Thursday Symptoms include nausea, vomiting, watery diarrhea Daughter developed symptoms two days previously, believes that she caught a GI virus from her, daughter attends daycare Has had more abdominal pain in the last few days with increased gas In the last 24 hours, has had 3 watery bowel movements Has been able to eat off and on since Thursday Has had reduced urination but has been able to keep fluids down No blood in vomit or stool Had some presyncope on Thursday and Saturday when she was vomiting No fevers Does endorse occasional headache Has not tried anything for her symptoms  ROS: per HPI  Pertinent PMHx: chronic nausea and vomiting, obesity  Exam:  Respiratory: no shortness of breath or cough  Assessment/Plan:  Viral gastroenteritis Likely viral illness overlying chronic IBS.  I am reassured that patient can tolerate fluids and is eating today.  Sent Zofran and Bentyl for nausea and abdominal cramping respectively.  Encouraged frequent hydration and monitoring urine output.  Return precautions given.    Time spent during visit with patient: 10 minutes

## 2019-10-22 ENCOUNTER — Other Ambulatory Visit: Payer: Self-pay

## 2019-10-25 MED ORDER — ALBUTEROL SULFATE HFA 108 (90 BASE) MCG/ACT IN AERS: 2.0000 | INHALATION_SPRAY | Freq: Four times a day (QID) | RESPIRATORY_TRACT | 5 refills | Status: DC | PRN

## 2019-10-25 MED ORDER — CETIRIZINE HCL 10 MG PO TABS: 10.0000 mg | ORAL_TABLET | Freq: Every day | ORAL | 11 refills | Status: DC

## 2019-10-25 MED ORDER — TRIAMCINOLONE ACETONIDE 0.1 % EX CREA: 1.0000 "application " | TOPICAL_CREAM | Freq: Two times a day (BID) | CUTANEOUS | 0 refills | Status: DC

## 2019-12-30 ENCOUNTER — Other Ambulatory Visit: Payer: Self-pay

## 2019-12-30 ENCOUNTER — Ambulatory Visit (INDEPENDENT_AMBULATORY_CARE_PROVIDER_SITE_OTHER): Payer: Medicaid Other | Admitting: Family Medicine

## 2019-12-30 VITALS — BP 115/80 | HR 78 | Wt 247.2 lb

## 2019-12-30 DIAGNOSIS — L910 Hypertrophic scar: Secondary | ICD-10-CM

## 2019-12-30 MED ORDER — TRIAMCINOLONE ACETONIDE 40 MG/ML IJ SUSP
40.0000 mg | Freq: Once | INTRAMUSCULAR | Status: AC
  Administered 2019-12-30: 40 mg via INTRADERMAL

## 2019-12-30 NOTE — Assessment & Plan Note (Signed)
Discussed with Alice Gibson that steroid injections can reduce the size of keloids, although reduction in size is not a guarantee, and this effect is not always permanent.  Regarding her request for removal of the larger keloid, we will place a referral to plastic surgery since this procedure is not routinely done in this clinic, and there are different methods for reducing the risk of a large keloid forming after this 1 is removed.  We reviewed the risks of removal of keloids since a keloid is a scar, and further trauma could create other keloids.  After consent was obtained with a discussion of risks and benefits, steroid injection was provided in each of the 2 outer keloids.  See procedure note below.  She will plan to follow-up in 4 to 6 weeks.

## 2019-12-30 NOTE — Patient Instructions (Signed)
It was nice meeting you today Alice Gibson!  Today, we injected your keloids with steroids and placed a referral to plastic surgery to see if they will remove the larger keloid.  If you do not hear from them in the next 2 weeks, please let us know.  We would like to see you back in about 4 to 6 weeks for another injection if you would like this.  Please let us know if you experience any swelling, redness, or increased pain.  If you have any questions or concerns, please feel free to call the clinic.   Be well,  Dr. Frances Furbish

## 2019-12-30 NOTE — Progress Notes (Addendum)
    SUBJECTIVE:   CHIEF COMPLAINT / HPI:   Keloids on right ear Patient reports that 3 years ago, she got a piercing in the cartilage of her right ear, which was supposed to be the industrial bar, requiring piercings of both her superior and lateral right cartilage.  Since then, she has developed to keloids on the outer areas of these piercings, with smaller keloids on the inside areas.  These have continued to grow larger and cause difficulties with self-esteem.  She is hoping to have the bigger one on the top of her ear removed and would like injections and the smaller ones possible.  She has not yet tried anything to reduce the size of her keloids.  They sometimes will itch, but they do not bleed or hurt.  PERTINENT  PMH / PSH: None  OBJECTIVE:   BP 115/80   Pulse 78   Wt 247 lb 3.2 oz (112.1 kg)   SpO2 98%   BMI 49.93 kg/m   General: well appearing, appears stated age Skin: 2 keloids are present on the outside of the cartilage of the right ear, and small keloids are present on the inner area of the cartilage as well.  Please refer to photograph below.          ASSESSMENT/PLAN:   Keloid Discussed with Alice Gibson that steroid injections can reduce the size of keloids, although reduction in size is not a guarantee, and this effect is not always permanent.  Regarding her request for removal of the larger keloid, we will place a referral to plastic surgery since this procedure is not routinely done in this clinic, and there are different methods for reducing the risk of a large keloid forming after this 1 is removed.  We reviewed the risks of removal of keloids since a keloid is a scar, and further trauma could create other keloids.  After consent was obtained with a discussion of risks and benefits, steroid injection was provided in each of the 2 outer keloids.  See procedure note below.  She will plan to follow-up in 4 to 6 weeks.   A steroid injection was performed at the  described areas above using 40 mg of Kenalog. This was well tolerated.  Lennox Solders, MD [facility]    I was present during relevant parts of the history and physical  Carney Living

## 2020-01-21 ENCOUNTER — Other Ambulatory Visit: Payer: Self-pay

## 2020-01-21 ENCOUNTER — Encounter (HOSPITAL_COMMUNITY): Payer: Self-pay | Admitting: Obstetrics and Gynecology

## 2020-01-21 ENCOUNTER — Inpatient Hospital Stay (HOSPITAL_COMMUNITY): Payer: Medicaid Other

## 2020-01-21 ENCOUNTER — Inpatient Hospital Stay (HOSPITAL_COMMUNITY)
Admission: AD | Admit: 2020-01-21 | Discharge: 2020-01-21 | Disposition: A | Discharge status: Home / Self Care | Payer: Medicaid Other | Attending: Obstetrics and Gynecology | Admitting: Obstetrics and Gynecology

## 2020-01-21 DIAGNOSIS — Z3687 Encounter for antenatal screening for uncertain dates: Secondary | ICD-10-CM | POA: Insufficient documentation

## 2020-01-21 DIAGNOSIS — O3680X0 Pregnancy with inconclusive fetal viability, not applicable or unspecified: Secondary | ICD-10-CM | POA: Diagnosis not present

## 2020-01-21 DIAGNOSIS — Z87891 Personal history of nicotine dependence: Secondary | ICD-10-CM | POA: Insufficient documentation

## 2020-01-21 DIAGNOSIS — N939 Abnormal uterine and vaginal bleeding, unspecified: Secondary | ICD-10-CM

## 2020-01-21 DIAGNOSIS — M419 Scoliosis, unspecified: Secondary | ICD-10-CM | POA: Diagnosis not present

## 2020-01-21 DIAGNOSIS — Z79899 Other long term (current) drug therapy: Secondary | ICD-10-CM | POA: Diagnosis not present

## 2020-01-21 DIAGNOSIS — O209 Hemorrhage in early pregnancy, unspecified: Secondary | ICD-10-CM | POA: Diagnosis present

## 2020-01-21 DIAGNOSIS — O26899 Other specified pregnancy related conditions, unspecified trimester: Secondary | ICD-10-CM | POA: Diagnosis not present

## 2020-01-21 DIAGNOSIS — O4690 Antepartum hemorrhage, unspecified, unspecified trimester: Secondary | ICD-10-CM | POA: Diagnosis not present

## 2020-01-21 DIAGNOSIS — Z3A00 Weeks of gestation of pregnancy not specified: Secondary | ICD-10-CM | POA: Insufficient documentation

## 2020-01-21 LAB — COMPREHENSIVE METABOLIC PANEL
ALT: 17 U/L (ref 0–44)
AST: 16 U/L (ref 15–41)
Albumin: 3.9 g/dL (ref 3.5–5.0)
Alkaline Phosphatase: 53 U/L (ref 38–126)
Anion gap: 9 (ref 5–15)
BUN: 8 mg/dL (ref 6–20)
CO2: 21 mmol/L — ABNORMAL LOW (ref 22–32)
Calcium: 8.7 mg/dL — ABNORMAL LOW (ref 8.9–10.3)
Chloride: 107 mmol/L (ref 98–111)
Creatinine, Ser: 0.63 mg/dL (ref 0.44–1.00)
GFR calc Af Amer: >60 mL/min (ref >60)
GFR calc non Af Amer: >60 mL/min (ref >60)
Glucose, Bld: 93 mg/dL (ref 70–99)
Potassium: 4.1 mmol/L (ref 3.5–5.1)
Sodium: 137 mmol/L (ref 135–145)
Total Bilirubin: 0.5 mg/dL (ref 0.3–1.2)
Total Protein: 6.7 g/dL (ref 6.5–8.1)

## 2020-01-21 LAB — CBC
HCT: 41.9 % (ref 36.0–46.0)
Hemoglobin: 13.7 g/dL (ref 12.0–15.0)
MCH: 29.3 pg (ref 26.0–34.0)
MCHC: 32.7 g/dL (ref 30.0–36.0)
MCV: 89.5 fL (ref 80.0–100.0)
Platelets: 335 10*3/uL (ref 150–400)
RBC: 4.68 MIL/uL (ref 3.87–5.11)
RDW: 12.6 % (ref 11.5–15.5)
WBC: 10.5 10*3/uL (ref 4.0–10.5)
nRBC: 0 % (ref 0.0–0.2)

## 2020-01-21 LAB — WET PREP, GENITAL
Clue Cells Wet Prep HPF POC: NONE SEEN
Sperm: NONE SEEN
Trich, Wet Prep: NONE SEEN
Yeast Wet Prep HPF POC: NONE SEEN

## 2020-01-21 LAB — HCG, QUANTITATIVE, PREGNANCY: hCG, Beta Chain, Quant, S: 1709 m[IU]/mL — ABNORMAL HIGH (ref <5)

## 2020-01-21 NOTE — MAU Note (Signed)
Pt presents to MAU with c/o bleeding that started 1.5 days ago, had + pregnancy test completed at [facility] on 01/18/2020. Pt is unsure of LMP.

## 2020-01-21 NOTE — Discharge Instructions (Signed)
Miscarriage A miscarriage is the loss of an unborn baby (fetus) before the 20th week of pregnancy. Follow these instructions at home: Medicines   Take over-the-counter and prescription medicines only as told by your doctor.  If you were prescribed antibiotic medicine, take it as told by your doctor. Do not stop taking the antibiotic even if you start to feel better.  Do not take NSAIDs unless your doctor says that this is safe for you. NSAIDs include aspirin and ibuprofen. These medicines can cause bleeding. Activity  Rest as directed. Ask your doctor what activities are safe for you.  Have someone help you at home during this time. General instructions  Write down how many pads you use each day and how soaked they are.  Watch the amount of tissue or clumps of blood (blood clots) that you pass from your vagina. Save any large amounts of tissue for your doctor.  Do not use tampons, douche, or have sex until your doctor approves.  To help you and your partner with the process of grieving, talk with your doctor or seek counseling.  When you are ready, meet with your doctor to talk about steps you should take for your health. Also, talk with your doctor about steps to take to have a healthy pregnancy in the future.  Keep all follow-up visits as told by your doctor. This is important. Contact a doctor if:  You have a fever or chills.  You have vaginal discharge that smells bad.  You have more bleeding. Get help right away if:  You have very bad cramps or pain in your back or belly.  You pass clumps of blood that are walnut-sized or larger from your vagina.  You pass tissue that is walnut-sized or larger from your vagina.  You soak more than 1 regular pad in an hour.  You get light-headed or weak.  You faint (pass out).  You have feelings of sadness that do not go away, or you have thoughts of hurting yourself. Summary  A miscarriage is the loss of an unborn baby before  the 20th week of pregnancy.  Follow your doctor's instructions for home care. Keep all follow-up appointments.  To help you and your partner with the process of grieving, talk with your doctor or seek counseling. This information is not intended to replace advice given to you by your health care provider. Make sure you discuss any questions you have with your health care provider. Document Revised: 10/23/2018 Document Reviewed: 08/06/2016 Elsevier Patient Education  2020 Elsevier Inc.  

## 2020-01-21 NOTE — MAU Provider Note (Signed)
History     CSN: [id number]  Arrival date and time: 01/21/20 1429   First Provider Initiated Contact with Patient 01/21/20 1512      Chief Complaint  Patient presents with  . Vaginal Bleeding   HPI Alice Gibson is a 19 y.o. G2P1 at [place] who presents to MAU with chief complaint of heavy vaginal bleeding in the setting of positive home pregnancy test. Patient also has pregnancy confirmation at local private clinic. Patient states her bleeding began around 1330 today while she was at work "like water running down my legs". She denies pain, abdominal tenderness, weakness, syncope. She is remote from intercourse.  OB History    Gravida  2   Para  1   Term      Preterm      AB      Living  1     SAB      TAB      Ectopic      Multiple  0   Live Births  1           Past Medical History:  Diagnosis Date  . Abnormal urination 04/26/2014  . Allergic rhinitis due to cats 05/02/2016  . Allergic to animal dander 05/02/2016   Equivocal reactivity to Cat and to Dog danders on skin-prick testing by Dr Ishmael Holter ([place], 01/2008)  . Allergy to mold 05/02/2016   Equivocal reactivity to mold on skin-prick testing by Dr Ishmael Holter ([facility], 01/2008)  . Allergy to pollen 05/02/2016   Strong allergic response to skin-prick testing ( Dr Ishmael Holter (Allergist) 01/2008) to tree pollens  . 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 Asthma   . Asthma, mild persistent 03/12/2007   Qualifier: Diagnosis of  By: Zebedee Iba NP, Manuela Schwartz     . ASTHMA, PERSISTENT 03/12/2007   Qualifier: Diagnosis of  By: Zebedee Iba NP, Manuela Schwartz     . Back pain 05/18/2013  . CHILDHOOD OBESITY 10/27/2008   Qualifier: Diagnosis of  By: McDiarmid MD, Sherren Mocha    . Constipation 10/05/2013  . DERMATITIS, ATOPIC 12/18/2007   Qualifier: Diagnosis of  By: McDiarmid MD, Sherren Mocha    . Disordered sleep 12/02/2016  . Disordered sleep 12/02/2016  . Eczema 11/28/2015  . Exercise-induced asthma 05/02/2016  . Expressive language disorder  07/30/2010   Qualifier: Diagnosis of  By: McDiarmid MD, Sherren Mocha    . 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 History of atopic dermatitis 12/18/2007   History of atopic dermatitis as child.     Marland Kitchen History of atopic dermatitis 12/18/2007   History of atopic dermatitis as child.     Marland Kitchen History of major depression 04/27/2015  . 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 Major depressive disorder in remission ([place]) 07/21/2015  . Major depressive disorder in remission ([place]) 07/21/2015  . MDD (major depressive disorder), recurrent episode, severe ([place]) 04/27/2015  . Obesity 10/27/2008   Qualifier: Diagnosis of  By: McDiarmid MD, Sherren Mocha    . Otalgia of right ear 07/21/2015  . Peanut allergy 10/05/2013  . Scoliosis, adolescent acquired 09/11/2015  . SEIZURE DISORDER, HX OF 04/02/2007   Qualifier: Diagnosis of  By: McDiarmid MD, Sherren Mocha    . Seizures ([place])   . Severe episode of recurrent major depressive disorder, without psychotic features ([place])   . Smoking 07/21/2015  . Superficial  acne vulgaris 05/02/2016  . Syncope 07/21/2015    Past Surgical History:  Procedure Laterality Date  . NO PAST SURGERIES      Family History  Problem Relation Age of Onset  . Alcohol abuse Mother 69  . Drug abuse Mother 88  . Mental illness Mother 57  . Clotting disorder Mother 18       Hypercoagulopathy undefined but causing large aortic thromboemboli  . Kidney disease Father   . Diabetes Maternal Grandmother   . Colon cancer Neg Hx   . Esophageal cancer Neg Hx   . Pancreatic cancer Neg Hx   . Stomach cancer Neg Hx     Social History   Tobacco Use  . Smoking status: Former Smoker    Types: Cigarettes  . Smokeless tobacco: Never Used  Vaping Use  . Vaping Use: Never used  Substance Use Topics  . Alcohol use: Not Currently    Comment: occassionally  . Drug use: Not Currently    Types:  Marijuana    Allergies:  Allergies  Allergen Reactions  . Valproic Acid Other (See Comments)    REACTION: essential tremor    Medications Prior to Admission  Medication Sig Dispense Refill Last Dose  . albuterol (VENTOLIN HFA) 108 (90 Base) MCG/ACT inhaler Inhale 2 puffs into the lungs every 6 (six) hours as needed for wheezing or shortness of breath. 18 g 5 More than a month at Unknown time  . cetirizine (ZYRTEC) 10 MG tablet Take 1 tablet (10 mg total) by mouth daily. 30 tablet 11 More than a month at Unknown time  . dicyclomine (BENTYL) 10 MG capsule Take 1 capsule (10 mg total) by mouth 4 (four) times daily -  before meals and at bedtime. 21 capsule 0 More than a month at Unknown time  . glycopyrrolate (ROBINUL) 2 MG tablet Take 1 tablet (2 mg total) by mouth 2 (two) times daily. 60 tablet 11 More than a month at Unknown time  . ondansetron (ZOFRAN ODT) 4 MG disintegrating tablet Take 1 tablet (4 mg total) by mouth every 8 (eight) hours as needed for nausea or vomiting. 20 tablet 0 More than a month at Unknown time  . pantoprazole (PROTONIX) 40 MG tablet Take 1 tablet (40 mg total) by mouth daily. 30 tablet 11 More than a month at Unknown time  . triamcinolone cream (KENALOG) 0.1 % Apply 1 application topically 2 (two) times daily. 30 g 0 More than a month at Unknown time    Review of Systems  Gastrointestinal: Negative for abdominal pain.  Genitourinary: Positive for vaginal bleeding. Negative for dysuria.  Neurological: Negative for dizziness, syncope and weakness.  All other systems reviewed and are negative.  Physical Exam   Blood pressure 135/71, pulse 93, temperature 98.3 F (36.8 C), temperature source Oral, resp. rate 18, SpO2 100 %.  Physical Exam Vitals and nursing note reviewed. Exam conducted with a chaperone present.  Constitutional:      General: She is not in acute distress.    Appearance: She is not ill-appearing or toxic-appearing.  Cardiovascular:     Rate  and Rhythm: Normal rate.     Pulses: Normal pulses.  Pulmonary:     Effort: Pulmonary effort is normal.     Breath sounds: Normal breath sounds.  Abdominal:     General: Abdomen is flat. Bowel sounds are normal.     Tenderness: There is no abdominal tenderness.  Genitourinary:    General: Normal vulva.  Comments: Pelvic exam: External genitalia normal, vaginal walls pink and well rugated, cervix visually closed, no lesions noted. Moderate bright red bleeding noted throughout vault. Bimanual: cervix 0/thick/posterior, neg CMT, uterus nontender, no adnexal tenderness noted.  Skin:    General: Skin is warm.     Capillary Refill: Capillary refill takes less than 2 seconds.  Neurological:     General: No focal deficit present.     Mental Status: She is alert.  Psychiatric:        Mood and Affect: Mood normal.        Thought Content: Thought content normal.     MAU Course/MDM  Procedures  Orders Placed This Encounter  Procedures  . Wet prep, genital  . US OB LESS THAN 14 WEEKS WITH OB TRANSVAGINAL  . CBC  . Comprehensive metabolic panel  . hCG, quantitative, pregnancy   Results for orders placed or performed during the hospital encounter of 01/21/20 (from the past 24 hour(s))  Wet prep, genital     Status: Abnormal   Collection Time: 01/21/20  3:09 PM   Specimen: Vaginal  Result Value Ref Range   Yeast Wet Prep HPF POC NONE SEEN NONE SEEN   Trich, Wet Prep NONE SEEN NONE SEEN   Clue Cells Wet Prep HPF POC NONE SEEN NONE SEEN   WBC, Wet Prep HPF POC FEW (A) NONE SEEN   Sperm NONE SEEN   CBC     Status: None   Collection Time: 01/21/20  3:59 PM  Result Value Ref Range   WBC 10.5 4.0 - 10.5 K/uL   RBC 4.68 3.87 - 5.11 MIL/uL   Hemoglobin 13.7 12.0 - 15.0 g/dL   HCT 41.9 36 - 46 %   MCV 89.5 80.0 - 100.0 fL   MCH 29.3 26.0 - 34.0 pg   MCHC 32.7 30.0 - 36.0 g/dL   RDW 12.6 11.5 - 15.5 %   Platelets 335 150 - 400 K/uL   nRBC 0.0 0.0 - 0.2 %  Comprehensive metabolic  panel     Status: Abnormal   Collection Time: 01/21/20  3:59 PM  Result Value Ref Range   Sodium 137 135 - 145 mmol/L   Potassium 4.1 3.5 - 5.1 mmol/L   Chloride 107 98 - 111 mmol/L   CO2 21 (L) 22 - 32 mmol/L   Glucose, Bld 93 70 - 99 mg/dL   BUN 8 6 - 20 mg/dL   Creatinine, Ser 0.63 0.44 - 1.00 mg/dL   Calcium 8.7 (L) 8.9 - 10.3 mg/dL   Total Protein 6.7 6.5 - 8.1 g/dL   Albumin 3.9 3.5 - 5.0 g/dL   AST 16 15 - 41 U/L   ALT 17 0 - 44 U/L   Alkaline Phosphatase 53 38 - 126 U/L   Total Bilirubin 0.5 0.3 - 1.2 mg/dL   GFR calc non Af Amer >60 >60 mL/min   GFR calc Af Amer >60 >60 mL/min   Anion gap 9 5 - 15  hCG, quantitative, pregnancy     Status: Abnormal   Collection Time: 01/21/20  3:59 PM  Result Value Ref Range   hCG, Beta Chain, Quant, S 1,709 (H) <5 mIU/mL   US OB LESS THAN 14 WEEKS WITH OB TRANSVAGINAL  Result Date: 01/21/2020 CLINICAL DATA:  Heavy vaginal bleeding, first trimester pregnancy; uncertain LMP; quantitative beta hCG pending EXAM: OBSTETRIC <14 WK [place] AND TRANSVAGINAL OB US TECHNIQUE: Both transabdominal and transvaginal ultrasound examinations were performed for complete evaluation of  the gestation as well as the maternal uterus, adnexal regions, and pelvic cul-de-sac. Transvaginal technique was performed to assess early pregnancy. COMPARISON:  None FINDINGS: Intrauterine gestational sac: Absent Yolk sac:  N/A Embryo:  N/A Cardiac Activity: N/A Heart Rate: N/A  bpm MSD:   mm    w     d CRL:    mm    w    d                  [place] EDC: Subchorionic hemorrhage:  N/A Maternal uterus/adnexae: Uterus anteverted without focal mass. Endometrial complex heterogeneous and focally thickened at the mid to lower uterine segments up to 13 mm thick, suspicious for blood, cannot exclude products of conception. No discrete gestational sac identified. LEFT ovary normal size and morphology, 2.9 x 1.9 x 1.4 cm. RIGHT ovary normal size and morphology, 2.6 x 2.8 x 1.7 cm. No free pelvic fluid or  adnexal masses. IMPRESSION: Abnormal heterogeneous appearing material within the endometrial canal, nonspecific but suspicious for blood; cannot exclude products of conception. No intrauterine gestation is definitely identified. Findings are consistent with pregnancy of unknown location. Differential diagnosis includes early intrauterine pregnancy too early to visualize, spontaneous abortion, and ectopic pregnancy. Serial quantitative beta HCG and or follow-up ultrasound recommended to definitively exclude ectopic pregnancy. Electronically Signed   By: Lavonia Dana M.D.   On: 01/21/2020 16:59   --Patient met by two CNMs and multiple RNs on arrival due to MAU registration staff's concern for near syncope. Patient A&O on my initial exam, ambulating independently, no pallor or increased WOB, speaking in full sentences --While in room to discuss imaging results, patient inquired about Cytotec. Discussed today's evaluation, lack of previous information and possibility of early pregnancy vs miscarriage. Patient desires pregnancy and elects for 48 hour stat Quant hCG --Hemodynamically stable --Scant bleeding in MAU  Assessment and Plan  --19 y.o. G2P1 with pregnancy of unknown location --A POS, Rhogam not indicated --Discharge home in stable condition with bleeding precautions  F/U: --Return to MAU Sunday after 1600 hours for repeat stat Quant hCG  Darlina Rumpf , CNM 01/21/2020, 9:30 PM

## 2020-01-24 LAB — GC/CHLAMYDIA PROBE AMP (~~LOC~~) NOT AT ARMC
Chlamydia: NEGATIVE
Comment: NEGATIVE
Comment: NORMAL
Neisseria Gonorrhea: NEGATIVE

## 2020-01-26 ENCOUNTER — Other Ambulatory Visit: Payer: Self-pay

## 2020-01-26 ENCOUNTER — Ambulatory Visit (INDEPENDENT_AMBULATORY_CARE_PROVIDER_SITE_OTHER): Payer: Medicaid Other | Admitting: Family Medicine

## 2020-01-26 ENCOUNTER — Encounter: Payer: Self-pay | Admitting: Family Medicine

## 2020-01-26 VITALS — BP 118/80 | HR 89 | Ht 60.0 in | Wt 239.2 lb

## 2020-01-26 DIAGNOSIS — O3680X0 Pregnancy with inconclusive fetal viability, not applicable or unspecified: Secondary | ICD-10-CM | POA: Diagnosis not present

## 2020-01-26 NOTE — Progress Notes (Signed)
Subjective:   Patient ID: Alice Gibson    DOB: 2001/05/17, 19 y.o. female   MRN: [id number]  Alice Gibson is a 19 y.o. female with a history of asthma, possible IBS, chronic vomiting, eczema, morbid obesity, chronic pain of left knee here for miscarriage follow up.  Follow up Miscarriage: Patient is a 19 y.o. G2P1011 here today to follow up from suspected miscarriage on 01/21/20. She was seen in MAU for heavy vaginal bleeding in setting of positive pregnancy test (home and conformed at local private clinic). She denied any pain, abdominal tenderness, weakness, syncope.   Pelvic exam during encounter notable for "External genitalia normal, vaginal walls pink and well rugated, cervix visually closed, no lesions noted. Moderate bright red bleeding noted throughout vault. Bimanual: cervix 0/thick/posterior, neg CMT, uterus nontender, no adnexal tenderness noted."  She was determined to be A positive blood type, thus no Rhogam was given.  US OB LESS THAN 14 WEEKS WITH OB TRANSVAGINAL Impression: Abnormal heterogeneous appearing material within the endometrial canal, nonspecific but suspicious for blood; cannot exclude products of conception. No intrauterine gestation is definitely identified. Findings are consistent with pregnancy of unknown location. Differential diagnosis includes early intrauterine pregnancy too early to visualize, spontaneous abortion, and ectopic pregnancy. Serial quantitative beta HCG and or follow-up ultrasound recommended to definitively exclude ectopic pregnancy. "  MAU obtained beta hCG of 1709. Patient was supposed to return on 7/11 for stat Quant hCG but appears she did not.   Today patient notes she has continued to have persistent mild bleeding with occasional blood clots of bright red blood.  She has intermittent midline cramps.  She is not sexually active.  She has had 1 prior pregnancy that was full-term.  Last menstrual period was 12/14/2019.  She is not on any birth  control, uses condoms as needed.  Review of Systems:  Per HPI.   Objective:   BP 118/80   Pulse 89   Ht 5' (1.524 m)   Wt 239 lb 3.2 oz (108.5 kg)   LMP  (LMP Unknown)   SpO2 99%   BMI 46.72 kg/m  Vitals and nursing note reviewed.  General: Pleasant young female, sitting comfortably in exam chair, well nourished, well developed, in no acute distress with non-toxic appearance Resp: Breathing comfortably on room air, speaking full sentences Abdomen: soft, non-tender, non-distended, no masses or organomegaly palpable, normoactive bowel sounds Pelvic: mild midline pelvic pain, nontender to right or left side of lower abdomen or pelvis Skin: warm, dry, no rashes or lesions Extremities: warm and well perfused, normal tone MSK:  gait normal Neuro: Alert and oriented, speech normal   Assessment & Plan:   Pregnancy of unknown anatomic location Patient presenting for follow-up for possible spontaneous abortion starting on 01/21/2020. Transabdominal ultrasound unable to identify intrauterine pregnancy with differential including intrauterine pregnancy too early to visualize, spontaneous abortion, versus ectopic pregnancy. Patient is A positive thus no Rhogam indicated.   She has had continued mild bleeding with occasional blood clots and intermittent pelvic cramping. Most likely etiology includes spontaneous abortion. Most concerning but less likely differential includes ectopic given characterization of pain/bleeding and she is hemodynamically stable and very well-appearing without pain on exam today. Last beta-hCG was 1709.  She did not follow-up on 7/11 as recommended by MAU. Discussed case with attending OBGYN, Dr. Adrian Blackwater and Dr. Pollie Meyer. - obtain quant b-HCG to evaluate trend - If down-trending appropriately >> consistent with failed pregnancy.   - Plan for repeat beta-HCG in  1 week to confirm continued trend.   - Can stop trending once bleeding ceases and obvious trend is  appreciated. - if rising by 35% within last 72 hours >> concern for ectopic.   - Plan for repeat ultrasound and continued hcg trend - Strict return precautions discussed including worsening bleeding or pelvic pain. Patient instructed to report to MAU immediately - if bleeding persists will plan to obtain POC Hgb at follow up to monitor for anemia   Orders Placed This Encounter  Procedures  . Beta hCG quant (ref lab)   No orders of the defined types were placed in this encounter.   Orpah Cobb, DO PGY-3, [facility] 01/26/2020 8:07 PM

## 2020-01-26 NOTE — Assessment & Plan Note (Signed)
Patient presenting for follow-up for possible spontaneous abortion starting on 01/21/2020. Transabdominal ultrasound unable to identify intrauterine pregnancy with differential including intrauterine pregnancy too early to visualize, spontaneous abortion, versus ectopic pregnancy. Patient is A positive thus no Rhogam indicated.   She has had continued mild bleeding with occasional blood clots and intermittent pelvic cramping. Most likely etiology includes spontaneous abortion. Most concerning but less likely differential includes ectopic given characterization of pain/bleeding and she is hemodynamically stable and very well-appearing without pain on exam today. Last beta-hCG was 1709.  She did not follow-up on 7/11 as recommended by [facility]. Discussed case with attending OBGYN, Dr. Adrian Blackwater and Dr. Pollie Meyer. - obtain quant b-HCG to evaluate trend - If down-trending appropriately >> consistent with failed pregnancy.   - Plan for repeat beta-HCG in 1 week to confirm continued trend.   - Can stop trending once bleeding ceases and obvious trend is appreciated. - if rising by 35% within last 72 hours >> concern for ectopic.   - Plan for repeat ultrasound and continued hcg trend - Strict return precautions discussed including worsening bleeding or pelvic pain. Patient instructed to report to [facility] immediately - if bleeding persists will plan to obtain POC Hgb at follow up to monitor for anemia

## 2020-01-26 NOTE — Patient Instructions (Signed)
Thank you for coming to see me today. It was a pleasure to see you.   It appears that you have had a spontaneous miscarriage.  I am obtaining an hCG level to further evaluate to ensure appropriate downtrending.  I will call you with the results and further instructions.  If you have worsening bleeding or abdominal pain please go to the MAU immediately for further evaluation.  If you have any questions or concerns, please do not hesitate to call the office at [phone].  Take Care,  Dr. Orpah Cobb, DO Resident Physician [facility] [phone]

## 2020-01-27 LAB — BETA HCG QUANT (REF LAB): hCG Quant: 295 m[IU]/mL

## 2020-02-03 ENCOUNTER — Other Ambulatory Visit: Payer: Self-pay

## 2020-02-03 ENCOUNTER — Ambulatory Visit (INDEPENDENT_AMBULATORY_CARE_PROVIDER_SITE_OTHER): Payer: Medicaid Other | Admitting: Family Medicine

## 2020-02-03 VITALS — BP 130/70 | HR 82 | Ht 60.0 in | Wt 240.2 lb

## 2020-02-03 DIAGNOSIS — L910 Hypertrophic scar: Secondary | ICD-10-CM | POA: Diagnosis present

## 2020-02-03 NOTE — Assessment & Plan Note (Signed)
Discussed with Alice Gibson that steroid injections can reduce the size of keloids, although reduction in size is not a guarantee, and this effect is not always permanent. We reviewed the risks of removal of keloids since a keloid is a scar, and further trauma could create other keloids. After consent was obtained with a discussion of risks and benefits, steroid injection was provided in each of the 2 outer keloids. See procedure note below. She will plan to follow-up in 4 to 6 weeks.    Keloid Injection Procedure Note   PRE-OP DIAGNOSIS: Central anterior chest keloid   POST-OP DIAGNOSIS: Same    PROCEDURE: Kenalog injection of keloid    Performing Physician: Katha Cabal, DO  PROCEDURE:  Procedure area prepared with alcohol. Area allowed to dry. Site injected circumferentially with kenalog-40 creating adequate wheals. Minimal bleeding at injection site. Wiped cleaned with gauze. Covered with a bandage.   Tolerated well by patient. Given return precaution and follow up instructions.

## 2020-02-03 NOTE — Patient Instructions (Addendum)
It was nice seeing you today. We completed your right earlobe keloid injection today which you tolerated well. You may return in 4-6 weeks for repeat injection.  Please call or return sooner if you have persistent pain, swelling, redness or discharge from the injection sites. Otherwise, follow-up with your PCP for other health care needed.  Dr. Rachael Darby

## 2020-02-03 NOTE — Progress Notes (Signed)
    SUBJECTIVE:   CHIEF COMPLAINT / HPI:   Chief Complaint  Patient presents with  . Keloid   Alice Gibson is a 19 y.o. female here for follow up for keloid injections. Keloid began after ear piercing.  This is her 2nd injection. The two inner keloids of the rim of pinna have resolved.  Reports some itching at the larger most superior keloid.  Pt would like to continue with keloid injections today.         PERTINENT  PMH / PSH: keloids  OBJECTIVE:   BP (!) 130/70   Pulse 82   Ht 5' (1.524 m)   Wt (!) 240 lb 3.2 oz (109 kg)   LMP  (LMP Unknown)   SpO2 98%   BMI 46.91 kg/m    GEN: well appearing female in no acute distress  RESP: speaking in full sentences without pause  SKIN: 2 keloids present on the outer rim of the pinna (see images below)       ASSESSMENT/PLAN:   Keloid Discussed with Lanora Manis that steroid injections can reduce the size of keloids, although reduction in size is not a guarantee, and this effect is not always permanent. We reviewed the risks of removal of keloids since a keloid is a scar, and further trauma could create other keloids. After consent was obtained with a discussion of risks and benefits, steroid injection was provided in each of the 2 outer keloids. See procedure note below. She will plan to follow-up in 4 to 6 weeks.    Keloid Injection Procedure Note   PRE-OP DIAGNOSIS: Central anterior chest keloid   POST-OP DIAGNOSIS: Same    PROCEDURE: Kenalog injection of keloid    Performing Physician: Katha Cabal, DO  PROCEDURE:  Procedure area prepared with alcohol. Area allowed to dry. Site injected circumferentially with kenalog-40 creating adequate wheals. Minimal bleeding at injection site. Wiped cleaned with gauze. Covered with a bandage.   Tolerated well by patient. Given return precaution and follow up instructions.       Katha Cabal, DO PGY-2, [facility] 02/03/2020 4:54 PM

## 2020-02-04 MED ORDER — TRIAMCINOLONE ACETONIDE 40 MG/ML IJ SUSP
40.0000 mg | Freq: Once | INTRAMUSCULAR | Status: AC
  Administered 2020-02-03: 40 mg via INTRAMUSCULAR

## 2020-02-04 NOTE — Addendum Note (Signed)
Addended by: [name] D on: 02/04/2020 08:01 AM   Modules accepted: Orders

## 2020-03-09 ENCOUNTER — Other Ambulatory Visit: Payer: Self-pay

## 2020-03-09 ENCOUNTER — Ambulatory Visit (INDEPENDENT_AMBULATORY_CARE_PROVIDER_SITE_OTHER): Payer: Medicaid Other | Admitting: Family Medicine

## 2020-03-09 VITALS — BP 118/75 | HR 102 | Ht 60.0 in | Wt 252.0 lb

## 2020-03-09 DIAGNOSIS — L910 Hypertrophic scar: Secondary | ICD-10-CM | POA: Diagnosis present

## 2020-03-09 MED ORDER — TRIAMCINOLONE ACETONIDE 40 MG/ML IJ SUSP
40.0000 mg | Freq: Once | INTRAMUSCULAR | Status: AC
  Administered 2020-03-09: 40 mg via INTRADERMAL

## 2020-03-09 NOTE — Assessment & Plan Note (Addendum)
Intralesional Injection Procedure Note Diagnosis: keloid Location: see physical exam Informed Consent: Discussed risks (infection, pain, bleeding, bruising, thinning of the skin, loss of skin pigment, lack of resolution, and recurrence of lesion) and benefits of the procedure, as well as the alternatives. Informed consent was obtained. Preparation: The area was prepared a standard fashion. Anesthesia: not required Procedure Details: An intralesional injection was performed with Kenalog 40 mg/cc. 75% of the kenalog injected into the bigger lesion and the remaining 25% into the smaller lesion. Smaller lesion was injected by Dr. Allena Katz and Carollee Herter (MS3), under my supervision.  Total number of lesions injected: 2 Plan: The patient was instructed on post-op care. Recommend OTC analgesia as needed for pain.

## 2020-03-09 NOTE — Patient Instructions (Signed)
It was nice seeing you today. We did steroid injection of your right earlobe keloid. We will continue to monitor for improvement. You may follow-up in 4-6 weeks for repeat treatment. Call if you have any questions or concerns.

## 2020-03-09 NOTE — Progress Notes (Addendum)
  Patient ID: Alice Gibson, female   DOB: 2000-11-18, 19 y.o.   MRN: [id number]    SUBJECTIVE:   CHIEF COMPLAINT / HPI:   Keloid: Here to f/u with her right earlobe Keloid. She got steroid injection 4 weeks ago and felt there was minimal improvement since her last injection. No new concerns.   PERTINENT  PMH / PSH: PMX reviewed  OBJECTIVE:   BP 118/75   Pulse (!) 102   Ht 5' (1.524 m)   Wt 252 lb (114.3 kg)   LMP  (LMP Unknown)   SpO2 96%   BMI 49.22 kg/m   Physical Exam Vitals and nursing note reviewed.  Constitutional:      Appearance: Normal appearance.  Pulmonary:     Effort: Pulmonary effort is normal.  Skin:    Comments: <1cm firm, nodular mass on the tip of her right helix/earlobe. A smaller and softer mass posterior and inferior to her right helix  Neurological:     Mental Status: She is alert.          ASSESSMENT/PLAN:   Keloid Intralesional Injection Procedure Note Diagnosis: keloid Location: see physical exam Informed Consent: Discussed risks (infection, pain, bleeding, bruising, thinning of the skin, loss of skin pigment, lack of resolution, and recurrence of lesion) and benefits of the procedure, as well as the alternatives. Informed consent was obtained. Preparation: The area was prepared a standard fashion. Anesthesia: not required Procedure Details: An intralesional injection was performed with Kenalog 40 mg/cc. 75% of the kenalog injected into the bigger lesion and the remaining 25% into the smaller lesion. Smaller lesion was injected by Dr. Allena Katz and Carollee Herter (MS3), under my supervision. Total number of lesions injected: 2 Plan: The patient was instructed on post-op care. Recommend OTC analgesia as needed for pain.      Janit Pagan, MD [facility]

## 2020-03-29 ENCOUNTER — Other Ambulatory Visit: Payer: Self-pay | Admitting: Critical Care Medicine

## 2020-03-29 ENCOUNTER — Other Ambulatory Visit: Payer: Medicaid Other

## 2020-03-29 DIAGNOSIS — Z20822 Contact with and (suspected) exposure to covid-19: Secondary | ICD-10-CM

## 2020-03-31 LAB — SARS-COV-2, NAA 2 DAY TAT

## 2020-03-31 LAB — NOVEL CORONAVIRUS, NAA: SARS-CoV-2, NAA: NOT DETECTED

## 2020-04-23 ENCOUNTER — Emergency Department (HOSPITAL_COMMUNITY)
Admission: EM | Admit: 2020-04-23 | Discharge: 2020-04-23 | Disposition: A | Discharge status: Home / Self Care | Payer: Medicaid Other | Source: Home / Self Care | Attending: Emergency Medicine | Admitting: Emergency Medicine

## 2020-04-23 ENCOUNTER — Encounter (HOSPITAL_COMMUNITY): Payer: Self-pay | Admitting: Emergency Medicine

## 2020-04-23 ENCOUNTER — Emergency Department (HOSPITAL_COMMUNITY)
Admission: EM | Admit: 2020-04-23 | Discharge: 2020-04-23 | Disposition: A | Discharge status: Home / Self Care | Payer: Medicaid Other | Attending: Emergency Medicine | Admitting: Emergency Medicine

## 2020-04-23 ENCOUNTER — Other Ambulatory Visit: Payer: Self-pay

## 2020-04-23 DIAGNOSIS — Z20822 Contact with and (suspected) exposure to covid-19: Secondary | ICD-10-CM | POA: Diagnosis not present

## 2020-04-23 DIAGNOSIS — R0989 Other specified symptoms and signs involving the circulatory and respiratory systems: Secondary | ICD-10-CM | POA: Insufficient documentation

## 2020-04-23 DIAGNOSIS — H9201 Otalgia, right ear: Secondary | ICD-10-CM | POA: Insufficient documentation

## 2020-04-23 DIAGNOSIS — Z87891 Personal history of nicotine dependence: Secondary | ICD-10-CM | POA: Insufficient documentation

## 2020-04-23 DIAGNOSIS — H6691 Otitis media, unspecified, right ear: Secondary | ICD-10-CM | POA: Insufficient documentation

## 2020-04-23 DIAGNOSIS — Z5321 Procedure and treatment not carried out due to patient leaving prior to being seen by health care provider: Secondary | ICD-10-CM | POA: Diagnosis not present

## 2020-04-23 DIAGNOSIS — R059 Cough, unspecified: Secondary | ICD-10-CM | POA: Insufficient documentation

## 2020-04-23 DIAGNOSIS — H6690 Otitis media, unspecified, unspecified ear: Secondary | ICD-10-CM

## 2020-04-23 DIAGNOSIS — J4530 Mild persistent asthma, uncomplicated: Secondary | ICD-10-CM | POA: Insufficient documentation

## 2020-04-23 LAB — RESPIRATORY PANEL BY RT PCR (FLU A&B, COVID)
Influenza A by PCR: NEGATIVE
Influenza B by PCR: NEGATIVE
SARS Coronavirus 2 by RT PCR: NEGATIVE

## 2020-04-23 MED ORDER — AMOXICILLIN-POT CLAVULANATE 875-125 MG PO TABS
1.0000 | ORAL_TABLET | Freq: Two times a day (BID) | ORAL | 0 refills | Status: DC
Start: 2020-04-23 — End: 2020-05-03

## 2020-04-23 NOTE — ED Triage Notes (Signed)
Patient c/o right ear pain since last night.

## 2020-04-23 NOTE — ED Notes (Signed)
Pt didn't answer when called for vitals  °

## 2020-04-23 NOTE — ED Provider Notes (Signed)
[facility]-EMERGENCY DEPT Provider Note   CSN: [id number] Arrival date & time: 04/23/20  1947     History Chief Complaint  Patient presents with  . Otalgia    Alice Gibson is a 19 y.o. female.  The history is provided by the patient.  Otalgia Location:  Right Behind ear:  No abnormality Quality:  Aching Severity:  Mild Onset quality:  Gradual Duration:  2 days Timing:  Constant Progression:  Worsening Chronicity:  New Context: not water in ear   Relieved by:  Nothing Worsened by:  Nothing Associated symptoms: congestion   Associated symptoms: no abdominal pain, no cough, no diarrhea, no ear discharge, no fever, no headaches, no hearing loss, no neck pain, no rash, no rhinorrhea, no sore throat, no tinnitus and no vomiting        Past Medical History:  Diagnosis Date  . Abnormal urination 04/26/2014  . Allergic rhinitis due to cats 05/02/2016  . Allergic to animal dander 05/02/2016   Equivocal reactivity to Cat and to Dog danders on skin-prick testing by Dr Willa Rough (Allergist, 01/2008)  . Allergy to mold 05/02/2016   Equivocal reactivity to mold on skin-prick testing by Dr Willa Rough (Allergist, 01/2008)  . Allergy to pollen 05/02/2016   Strong allergic response to skin-prick testing ( Dr Willa Rough (Allergist) 01/2008) to tree pollens  . 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 Asthma   . Asthma, mild persistent 03/12/2007   Qualifier: Diagnosis of  By: Humberto Seals NP, Darl Pikes     . ASTHMA, PERSISTENT 03/12/2007   Qualifier: Diagnosis of  By: Humberto Seals NP, Darl Pikes     . Back pain 05/18/2013  . CHILDHOOD OBESITY 10/27/2008   Qualifier: Diagnosis of  By: McDiarmid MD, Tawanna Cooler    . Constipation 10/05/2013  . DERMATITIS, ATOPIC 12/18/2007   Qualifier: Diagnosis of  By: McDiarmid MD, Tawanna Cooler    . Disordered sleep 12/02/2016  . Disordered sleep 12/02/2016  . Eczema 11/28/2015  . Exercise-induced asthma 05/02/2016  . Expressive language disorder 07/30/2010   Qualifier:  Diagnosis of  By: McDiarmid MD, Tawanna Cooler    . 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 History of atopic dermatitis 12/18/2007   History of atopic dermatitis as child.     Marland Kitchen History of atopic dermatitis 12/18/2007   History of atopic dermatitis as child.     Marland Kitchen History of major depression 04/27/2015  . 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 Major depressive disorder in remission (HCC) 07/21/2015  . Major depressive disorder in remission (HCC) 07/21/2015  . MDD (major depressive disorder), recurrent episode, severe (HCC) 04/27/2015  . Obesity 10/27/2008   Qualifier: Diagnosis of  By: McDiarmid MD, Tawanna Cooler    . Otalgia of right ear 07/21/2015  . Peanut allergy 10/05/2013  . Scoliosis, adolescent acquired 09/11/2015  . SEIZURE DISORDER, HX OF 04/02/2007   Qualifier: Diagnosis of  By: McDiarmid MD, Tawanna Cooler    . Seizures (HCC)   . Severe episode of recurrent major depressive disorder, without psychotic features (HCC)   . Smoking 07/21/2015  . Superficial acne vulgaris 05/02/2016  . Syncope 07/21/2015    Patient Active Problem List   Diagnosis Date Noted  . Pregnancy of unknown anatomic location 01/26/2020  . Keloid 05/06/2019  . Dysmenorrhea 03/16/2019  . Chronic vomiting 03/16/2019  . Quit smoking 03/15/2019  .  Numbness and tingling of left side of face 11/24/2018  . Possible IBS (irritable bowel syndrome) 05/29/2018  . Chronic pain of left knee 11/27/2017  . Atopic dermatitis 05/02/2016  . Exercise-induced asthma 05/02/2016  . Eczema 11/28/2015  . Morbid obesity (HCC) 10/27/2008  . Asthma, mild persistent 03/12/2007    Past Surgical History:  Procedure Laterality Date  . NO PAST SURGERIES       OB History    Gravida  2   Para  1   Term      Preterm      AB      Living  1     SAB      TAB      Ectopic      Multiple  0   Live Births  1            Family History  Problem Relation Age of Onset  . Alcohol abuse Mother 92  . Drug abuse Mother 72  . Mental illness Mother 39  . Clotting disorder Mother 28       Hypercoagulopathy undefined but causing large aortic thromboemboli  . Kidney disease Father   . Diabetes Maternal Grandmother   . Colon cancer Neg Hx   . Esophageal cancer Neg Hx   . Pancreatic cancer Neg Hx   . Stomach cancer Neg Hx     Social History   Tobacco Use  . Smoking status: Former Smoker    Types: Cigarettes  . Smokeless tobacco: Never Used  Vaping Use  . Vaping Use: Never used  Substance Use Topics  . Alcohol use: Not Currently    Comment: occassionally  . Drug use: Not Currently    Types: Marijuana    Home Medications Prior to Admission medications   Medication Sig Start Date End Date Taking? Authorizing Provider  albuterol (VENTOLIN HFA) 108 (90 Base) MCG/ACT inhaler Inhale 2 puffs into the lungs every 6 (six) hours as needed for wheezing or shortness of breath. 10/25/19   McDiarmid, Leighton Roach, MD  amoxicillin-clavulanate (AUGMENTIN) 875-125 MG tablet Take 1 tablet by mouth 2 (two) times daily for 10 days. 04/23/20 05/03/20  Kore Madlock, DO  cetirizine (ZYRTEC) 10 MG tablet Take 1 tablet (10 mg total) by mouth daily. 10/25/19   McDiarmid, Leighton Roach, MD  dicyclomine (BENTYL) 10 MG capsule Take 1 capsule (10 mg total) by mouth 4 (four) times daily -  before meals and at bedtime. 10/11/19   Lennox Solders, MD  glycopyrrolate (ROBINUL) 2 MG tablet Take 1 tablet (2 mg total) by mouth 2 (two) times daily. 06/23/19   Meryl Dare, MD  ondansetron (ZOFRAN ODT) 4 MG disintegrating tablet Take 1 tablet (4 mg total) by mouth every 8 (eight) hours as needed for nausea or vomiting. 10/11/19   Winfrey, Harlen Labs, MD  pantoprazole (PROTONIX) 40 MG tablet Take 1 tablet (40 mg total) by mouth daily. 06/23/19   Meryl Dare, MD  triamcinolone cream (KENALOG) 0.1 % Apply 1 application topically 2 (two) times daily.  10/25/19   McDiarmid, Leighton Roach, MD    Allergies    Valproic acid  Review of Systems   Review of Systems  Constitutional: Negative for fever.  HENT: Positive for congestion and ear pain. Negative for ear discharge, hearing loss, rhinorrhea, sinus pressure, sinus pain, sore throat, tinnitus, trouble swallowing and voice change.   Respiratory: Negative for cough.   Gastrointestinal: Negative for abdominal pain, diarrhea and vomiting.  Musculoskeletal: Negative for  neck pain.  Skin: Negative for rash.  Neurological: Negative for headaches.    Physical Exam Updated Vital Signs BP (!) 120/92 (BP Location: Right Arm)   Pulse 94   Temp 99.1 F (37.3 C) (Oral)   Resp 18   LMP  (LMP Unknown)   SpO2 94%   Physical Exam Constitutional:      General: She is not in acute distress.    Appearance: She is not ill-appearing.  HENT:     Head: Normocephalic.     Right Ear: External ear normal. There is no impacted cerumen.     Left Ear: Tympanic membrane, ear canal and external ear normal. There is no impacted cerumen.     Ears:     Comments: Right TM is red and inflamed Neurological:     Mental Status: She is alert.     ED Results / Procedures / Treatments   Labs (all labs ordered are listed, but only abnormal results are displayed) Labs Reviewed - No data to display  EKG None  Radiology No results found.  Procedures Procedures (including critical care time)  Medications Ordered in ED Medications - No data to display  ED Course  I have reviewed the triage vital signs and the nursing notes.  Pertinent labs & imaging results that were available during my care of the patient were reviewed by me and considered in my medical decision making (see chart for details).    MDM Rules/Calculators/A&P                          Arlicia ARANTXA PIERCEY is a 19 year old female who presents to the ED with right ear pain.  Exam is consistent with acute otitis media.  Will prescribe antibiotics.   Patient states that her last menstrual cycle was last week.  Will prescribe Augmentin.  Recommend Tylenol Motrin for pain.  Discharged in good condition.  This chart was dictated using voice recognition software.  Despite best efforts to proofread,  errors can occur which can change the documentation meaning.     Final Clinical Impression(s) / ED Diagnoses Final diagnoses:  Acute otitis media, unspecified otitis media type    Rx / DC Orders ED Discharge Orders         Ordered    amoxicillin-clavulanate (AUGMENTIN) 875-125 MG tablet  2 times daily        04/23/20 2000           Virgina Norfolk, DO 04/23/20 2002

## 2020-04-23 NOTE — ED Notes (Signed)
Called in waiting room for patient.  No response. 

## 2020-04-23 NOTE — ED Triage Notes (Signed)
Pt here with rt ear pain/pressure that started at 5am. Pt has been around daughter who has had a head cold (Runny nose and cough). Pt co runny nose x1 week and slight cough heard. Denies any fever at this time. VSS.

## 2020-04-27 ENCOUNTER — Ambulatory Visit (INDEPENDENT_AMBULATORY_CARE_PROVIDER_SITE_OTHER): Payer: Medicaid Other

## 2020-04-27 ENCOUNTER — Ambulatory Visit (INDEPENDENT_AMBULATORY_CARE_PROVIDER_SITE_OTHER): Payer: Medicaid Other | Admitting: Family Medicine

## 2020-04-27 ENCOUNTER — Other Ambulatory Visit: Payer: Self-pay

## 2020-04-27 VITALS — BP 120/87 | HR 104 | Wt 255.0 lb

## 2020-04-27 DIAGNOSIS — Z23 Encounter for immunization: Secondary | ICD-10-CM

## 2020-04-27 DIAGNOSIS — H60501 Unspecified acute noninfective otitis externa, right ear: Secondary | ICD-10-CM

## 2020-04-27 MED ORDER — OFLOXACIN 0.3 % OT SOLN
10.0000 [drp] | Freq: Every day | OTIC | 0 refills | Status: DC
Start: 2020-04-27 — End: 2020-05-03

## 2020-04-27 NOTE — Patient Instructions (Signed)

## 2020-04-27 NOTE — Progress Notes (Signed)
     SUBJECTIVE:   CHIEF COMPLAINT / HPI:   Right ear pain:Initial appointment was for keloid injection. However, given the pain with movement of her ear lobe, she opted not to get keloid injections today. She c/o right ear pain which started about 5 days ago. She denies any fever, no ear discharge. + muffled or reduced hearing since 4 days ago. She went to [facility] ED 4 days ago and was sent home on Augmentin with no improvement. She requested a reassessment.   Vaccine update:Here for COVID-19 and flu shot.   PERTINENT  PMH / PSH: PMX reviewed.  OBJECTIVE:   BP 120/87   Pulse (!) 104   Wt 255 lb (115.7 kg)   LMP 04/14/2020 (Exact Date)   SpO2 97%   Breastfeeding Unknown   BMI 49.80 kg/m   Physical Exam Vitals and nursing note reviewed.  HENT:     Right Ear: Tympanic membrane normal.     Left Ear: External ear normal.     Ears:     Comments: Hearing screening not done. Erythematous right external auditory canal, no discharge. Right TM without inflammation, swelling, bulging or redness. ++ tenderness with palpation and movement of her right ear canal.  Pulmonary:     Effort: Pulmonary effort is normal. No respiratory distress.  Musculoskeletal:     Cervical back: Neck supple.      ASSESSMENT/PLAN:  Right ear pain: Otitis externa. Ofloxacin otic prescribed. May use Tylenol as needed for pain. May d/c Augmentin since her symptoms worsened on it after 4 days of use. F/U in 1 week or sooner for reassessment. She agreed with the plan. ENT referral if there is no improvement of her hearing.   Vaccination: She already got flu shot before I saw her. I discussed holding off on COVID-19 vaccine till her symptoms resolves vs getting it today. She opted for delaying vaccination till she feels better.   Janit Pagan, MD [facility]

## 2020-04-28 NOTE — Progress Notes (Signed)
Patient presents to nurse clinic for flu vaccination. Administered in left deltoid, site unremarkable, tolerated injection well.   Veronda Prude, RN

## 2020-05-03 ENCOUNTER — Encounter: Payer: Self-pay | Admitting: Family Medicine

## 2020-05-03 ENCOUNTER — Other Ambulatory Visit: Payer: Self-pay

## 2020-05-03 ENCOUNTER — Ambulatory Visit (INDEPENDENT_AMBULATORY_CARE_PROVIDER_SITE_OTHER): Payer: Medicaid Other | Admitting: Family Medicine

## 2020-05-03 VITALS — BP 122/68 | HR 96 | Ht 60.0 in | Wt 243.2 lb

## 2020-05-03 DIAGNOSIS — H9201 Otalgia, right ear: Secondary | ICD-10-CM

## 2020-05-03 NOTE — Patient Instructions (Signed)
Thank you for coming to see me today. It was a pleasure. Today we talked about:   If you have pain that comes back in your ear, your hearing does not improve, or if your hearing worsens, please come back as soon as possible.  Especially if you develop fever and significant pain, you should come back for soon as possible.  You will need your second Covid vaccination in 21 days.  Please follow-up with [place] in 1 week if no improvement in your hearing.  If you have any questions or concerns, please do not hesitate to call the office at [phone].  Best,   Luis Abed, DO

## 2020-05-03 NOTE — Assessment & Plan Note (Signed)
Ear pain now resolved.  Continues to have some muffled hearing but is improving, given that her hearing is only slightly decreased, would hold off on any steroids.  She does not have any obvious signs of infection at this time although it is mildly erythematous.  Would not give her any more antibiotics at this time.  She was given appropriate return precautions including returning if pain returns, fever, decreased hearing, discharge from the ear.

## 2020-05-03 NOTE — Progress Notes (Signed)
    SUBJECTIVE:   CHIEF COMPLAINT / HPI:   F/U Otitis Externa, right Patient was seen on 10/14 by Dr. Lum Babe for right ear pain and was diagnosed with otitis externa She was prescribed ofloxacin She states that she has no pain now, improved since last week when she got the ear drops Her hearing continues to be muffled, but is hearing some and is improving No fevers Used the drops for seven days, stopped a few days ago Was also on augmentin for two days  COVID Vaccination She also presents today for her first COVID vaccination She has no questions today, is feeling well  PERTINENT  PMH / PSH: History of asthma, IBS, eczema, keloid, morbid obesity  OBJECTIVE:   BP 122/68   Pulse 96   Ht 5' (1.524 m)   Wt 243 lb 4 oz (110.3 kg)   LMP 04/08/2020   SpO2 97%   BMI 47.51 kg/m    Physical Exam:  General: 19 y.o. female in NAD HEENT: Right TM with some mild scarring, no effusion noted, no perforation noted, ear canal on right mildly erythematous without exudate, no tenderness to palpation of ear, tragus, mastoid on right, left TM and ear canal clear Lungs: Breathing comfortably on room air Skin: warm and dry Extremities: No edema, ambulating without difficulty   ASSESSMENT/PLAN:   Right ear pain Ear pain now resolved.  Continues to have some muffled hearing but is improving, given that her hearing is only slightly decreased, would hold off on any steroids.  She does not have any obvious signs of infection at this time although it is mildly erythematous.  Would not give her any more antibiotics at this time.  She was given appropriate return precautions including returning if pain returns, fever, decreased hearing, discharge from the ear.   Patient received her first code vaccination today, no reaction after being observed for 15 minutes.  She is to return in 21 days for a second Covid vaccination.  Unknown Jim, DO [facility]

## 2020-05-24 ENCOUNTER — Ambulatory Visit: Payer: Medicaid Other

## 2020-05-25 ENCOUNTER — Other Ambulatory Visit: Payer: Self-pay

## 2020-05-25 ENCOUNTER — Ambulatory Visit (INDEPENDENT_AMBULATORY_CARE_PROVIDER_SITE_OTHER): Payer: Medicaid Other | Admitting: Family Medicine

## 2020-05-25 VITALS — BP 110/70 | HR 72 | Ht 59.0 in | Wt 243.4 lb

## 2020-05-25 DIAGNOSIS — Z23 Encounter for immunization: Secondary | ICD-10-CM | POA: Diagnosis not present

## 2020-05-25 DIAGNOSIS — L910 Hypertrophic scar: Secondary | ICD-10-CM | POA: Diagnosis not present

## 2020-05-25 MED ORDER — TRIAMCINOLONE ACETONIDE 40 MG/ML IJ SUSP
40.0000 mg | Freq: Once | INTRAMUSCULAR | Status: AC
  Administered 2020-05-25: 40 mg via INTRADERMAL

## 2020-05-25 NOTE — Assessment & Plan Note (Signed)
Keloid injection of right ear helix with kenalog-40, procedure well tolerated. If not seeing results in 2 months, likely will not repeat injection.

## 2020-05-25 NOTE — Patient Instructions (Signed)
In 2 months, if you have having improvement in the size of the keloid, I recommend continuation of the injections and treatment. If the keloid is not improving after this injection in the next two months, it may be that your body is not going to respond to the treatment and we may not recommend any more injections at that point.

## 2020-05-25 NOTE — Progress Notes (Signed)
    SUBJECTIVE:   CHIEF COMPLAINT / HPI:   Right Ear Keloid F/U: Patient here for repeat keloid injection.. Patient does not feel that there has been any changes in the keloid since starting the injections, but would like to complete another one today.  PERTINENT  PMH / PSH: Reviewed  OBJECTIVE:   BP 110/70   Pulse 72   Ht 4\' 11"  (1.499 m)   Wt 243 lb 6.4 oz (110.4 kg)   LMP 04/24/2020 (Approximate)   SpO2 98%   BMI 49.16 kg/m       Keloid Injection Procedure Note  PRE-OP DIAGNOSIS:Keloid of right ear helix  POST-OP DIAGNOSIS: Same   PROCEDURE:Kenalog injection of keloid  Performing Physician: 06/24/2020, DO   PROCEDURE: Procedure area prepared with alcohol. Area was allowed to dry. Site injection circumferentially with kenalog-40 creating adequate wheals.  Minimal bleeding at injection site. Wiped clean with gauze and bacitracin placed on the site.  Tolerated well by patient. Given return precaution and follow-up instructions.   ASSESSMENT/PLAN:   Keloid Keloid injection of right ear helix with kenalog-40, procedure well tolerated. If not seeing results in 2 months, likely will not repeat injection.      Evelena Leyden, DO [facility]

## 2020-07-11 ENCOUNTER — Encounter: Payer: Self-pay | Admitting: Family Medicine

## 2020-07-11 ENCOUNTER — Other Ambulatory Visit: Payer: Self-pay

## 2020-07-11 ENCOUNTER — Ambulatory Visit (INDEPENDENT_AMBULATORY_CARE_PROVIDER_SITE_OTHER): Payer: Medicaid Other | Admitting: Family Medicine

## 2020-07-11 VITALS — BP 130/72 | HR 73 | Wt 234.4 lb

## 2020-07-11 DIAGNOSIS — R102 Pelvic and perineal pain unspecified side: Secondary | ICD-10-CM

## 2020-07-11 DIAGNOSIS — O219 Vomiting of pregnancy, unspecified: Secondary | ICD-10-CM

## 2020-07-11 DIAGNOSIS — Z3403 Encounter for supervision of normal first pregnancy, third trimester: Secondary | ICD-10-CM | POA: Insufficient documentation

## 2020-07-11 DIAGNOSIS — Z3492 Encounter for supervision of normal pregnancy, unspecified, second trimester: Secondary | ICD-10-CM | POA: Insufficient documentation

## 2020-07-11 DIAGNOSIS — Z3491 Encounter for supervision of normal pregnancy, unspecified, first trimester: Secondary | ICD-10-CM

## 2020-07-11 DIAGNOSIS — O26899 Other specified pregnancy related conditions, unspecified trimester: Secondary | ICD-10-CM | POA: Diagnosis not present

## 2020-07-11 LAB — POCT URINE PREGNANCY: Preg Test, Ur: POSITIVE — AB

## 2020-07-11 MED ORDER — DOXYLAMINE-PYRIDOXINE 10-10 MG PO TBEC: 10.0000 mg | DELAYED_RELEASE_TABLET | Freq: Every day | ORAL | 0 refills | Status: DC

## 2020-07-11 NOTE — Assessment & Plan Note (Signed)
No red flag sx identified. Pelvic pain possibly early onset lightning crotch. IF not resolved consider repeat US.

## 2020-07-11 NOTE — Assessment & Plan Note (Addendum)
Urine pregnancy test positive.  Initial prenatal labs drawn.  Discussed genetic testing with patient.  Patient consented to testing.  Nuchal translucency ultrasound ordered.  Doxylamine pyridoxine at bedtime for morning sickness.   [facility] visit  Follow up in 1 week for initial OB appointment

## 2020-07-11 NOTE — Patient Instructions (Addendum)
Congratulations! Follow up in 1 week for initial OB.   First Trimester of Pregnancy The first trimester of pregnancy is from week 1 until the end of week 13 (months 1 through 3). A week after a sperm fertilizes an egg, the egg will implant on the wall of the uterus. This embryo will begin to develop into a baby. Genes from you and your partner will form the baby. The female genes will determine whether the baby will be a boy or a girl. At 6-8 weeks, the eyes and face will be formed, and the heartbeat can be seen on ultrasound. At the end of 12 weeks, all the baby's organs will be formed. Now that you are pregnant, you will want to do everything you can to have a healthy baby. Two of the most important things are to get good prenatal care and to follow your health care provider's instructions. Prenatal care is all the medical care you receive before the baby's birth. This care will help prevent, find, and treat any problems during the pregnancy and childbirth. Body changes during your first trimester Your body goes through many changes during pregnancy. The changes vary from woman to woman.  You may gain or lose a couple of pounds at first.  You may feel sick to your stomach (nauseous) and you may throw up (vomit). If the vomiting is uncontrollable, call your health care provider.  You may tire easily.  You may develop headaches that can be relieved by medicines. All medicines should be approved by your health care provider.  You may urinate more often. Painful urination may mean you have a bladder infection.  You may develop heartburn as a result of your pregnancy.  You may develop constipation because certain hormones are causing the muscles that push stool through your intestines to slow down.  You may develop hemorrhoids or swollen veins (varicose veins).  Your breasts may begin to grow larger and become tender. Your nipples may stick out more, and the tissue that surrounds them (areola) may  become darker.  Your gums may bleed and may be sensitive to brushing and flossing.  Dark spots or blotches (chloasma, mask of pregnancy) may develop on your face. This will likely fade after the baby is born.  Your menstrual periods will stop.  You may have a loss of appetite.  You may develop cravings for certain kinds of food.  You may have changes in your emotions from day to day, such as being excited to be pregnant or being concerned that something may go wrong with the pregnancy and baby.  You may have more vivid and strange dreams.  You may have changes in your hair. These can include thickening of your hair, rapid growth, and changes in texture. Some women also have hair loss during or after pregnancy, or hair that feels dry or thin. Your hair will most likely return to normal after your baby is born. What to expect at prenatal visits During a routine prenatal visit:  You will be weighed to make sure you and the baby are growing normally.  Your blood pressure will be taken.  Your abdomen will be measured to track your baby's growth.  The fetal heartbeat will be listened to between weeks 10 and 14 of your pregnancy.  Test results from any previous visits will be discussed. Your health care provider may ask you:  How you are feeling.  If you are feeling the baby move.  If you have had any  abnormal symptoms, such as leaking fluid, bleeding, severe headaches, or abdominal cramping.  If you are using any tobacco products, including cigarettes, chewing tobacco, and electronic cigarettes.  If you have any questions. Other tests that may be performed during your first trimester include:  Blood tests to find your blood type and to check for the presence of any previous infections. The tests will also be used to check for low iron levels (anemia) and protein on red blood cells (Rh antibodies). Depending on your risk factors, or if you previously had diabetes during pregnancy,  you may have tests to check for high blood sugar that affects pregnant women (gestational diabetes).  Urine tests to check for infections, diabetes, or protein in the urine.  An ultrasound to confirm the proper growth and development of the baby.  Fetal screens for spinal cord problems (spina bifida) and Down syndrome.  HIV (human immunodeficiency virus) testing. Routine prenatal testing includes screening for HIV, unless you choose not to have this test.  You may need other tests to make sure you and the baby are doing well. Follow these instructions at home: Medicines  Follow your health care provider's instructions regarding medicine use. Specific medicines may be either safe or unsafe to take during pregnancy.  Take a prenatal vitamin that contains at least 600 micrograms (mcg) of folic acid.  If you develop constipation, try taking a stool softener if your health care provider approves. Eating and drinking   Eat a balanced diet that includes fresh fruits and vegetables, whole grains, good sources of protein such as meat, eggs, or tofu, and low-fat dairy. Your health care provider will help you determine the amount of weight gain that is right for you.  Avoid raw meat and uncooked cheese. These carry germs that can cause birth defects in the baby.  Eating four or five small meals rather than three large meals a day may help relieve nausea and vomiting. If you start to feel nauseous, eating a few soda crackers can be helpful. Drinking liquids between meals, instead of during meals, also seems to help ease nausea and vomiting.  Limit foods that are high in fat and processed sugars, such as fried and sweet foods.  To prevent constipation: ? Eat foods that are high in fiber, such as fresh fruits and vegetables, whole grains, and beans. ? Drink enough fluid to keep your urine clear or pale yellow. Activity  Exercise only as directed by your health care provider. Most women can  continue their usual exercise routine during pregnancy. Try to exercise for 30 minutes at least 5 days a week. Exercising will help you: ? Control your weight. ? Stay in shape. ? Be prepared for labor and delivery.  Experiencing pain or cramping in the lower abdomen or lower back is a good sign that you should stop exercising. Check with your health care provider before continuing with normal exercises.  Try to avoid standing for long periods of time. Move your legs often if you must stand in one place for a long time.  Avoid heavy lifting.  Wear low-heeled shoes and practice good posture.  You may continue to have sex unless your health care provider tells you not to. Relieving pain and discomfort  Wear a good support bra to relieve breast tenderness.  Take warm sitz baths to soothe any pain or discomfort caused by hemorrhoids. Use hemorrhoid cream if your health care provider approves.  Rest with your legs elevated if you have leg cramps  or low back pain.  If you develop varicose veins in your legs, wear support hose. Elevate your feet for 15 minutes, 3-4 times a day. Limit salt in your diet. Prenatal care  Schedule your prenatal visits by the twelfth week of pregnancy. They are usually scheduled monthly at first, then more often in the last 2 months before delivery.  Write down your questions. Take them to your prenatal visits.  Keep all your prenatal visits as told by your health care provider. This is important. Safety  Wear your seat belt at all times when driving.  Make a list of emergency phone numbers, including numbers for family, friends, the hospital, and police and fire departments. General instructions  Ask your health care provider for a referral to a local prenatal education class. Begin classes no later than the beginning of month 6 of your pregnancy.  Ask for help if you have counseling or nutritional needs during pregnancy. Your health care provider can offer  advice or refer you to specialists for help with various needs.  Do not use hot tubs, steam rooms, or saunas.  Do not douche or use tampons or scented sanitary pads.  Do not cross your legs for long periods of time.  Avoid cat litter boxes and soil used by cats. These carry germs that can cause birth defects in the baby and possibly loss of the fetus by miscarriage or stillbirth.  Avoid all smoking, herbs, alcohol, and medicines not prescribed by your health care provider. Chemicals in these products affect the formation and growth of the baby.  Do not use any products that contain nicotine or tobacco, such as cigarettes and e-cigarettes. If you need help quitting, ask your health care provider. You may receive counseling support and other resources to help you quit.  Schedule a dentist appointment. At home, brush your teeth with a soft toothbrush and be gentle when you floss. Contact a health care provider if:  You have dizziness.  You have mild pelvic cramps, pelvic pressure, or nagging pain in the abdominal area.  You have persistent nausea, vomiting, or diarrhea.  You have a bad smelling vaginal discharge.  You have pain when you urinate.  You notice increased swelling in your face, hands, legs, or ankles.  You are exposed to fifth disease or chickenpox.  You are exposed to [place] measles (rubella) and have never had it. Get help right away if:  You have a fever.  You are leaking fluid from your vagina.  You have spotting or bleeding from your vagina.  You have severe abdominal cramping or pain.  You have rapid weight gain or loss.  You vomit blood or material that looks like coffee grounds.  You develop a severe headache.  You have shortness of breath.  You have any kind of trauma, such as from a fall or a car accident. Summary  The first trimester of pregnancy is from week 1 until the end of week 13 (months 1 through 3).  Your body goes through many  changes during pregnancy. The changes vary from woman to woman.  You will have routine prenatal visits. During those visits, your health care provider will examine you, discuss any test results you may have, and talk with you about how you are feeling. This information is not intended to replace advice given to you by your health care provider. Make sure you discuss any questions you have with your health care provider. Document Revised: 06/13/2017 Document Reviewed: 06/12/2016 Elsevier Patient  Education  2020 [employer]  

## 2020-07-11 NOTE — Progress Notes (Signed)
   SUBJECTIVE:   CHIEF COMPLAINT / HPI:   Chief Complaint  Patient presents with  . Abdominal Pain     Alice Gibson is a 19 y.o. female here for pelvic pain.   Reports she lower sharp/shocking pelvic pain when she was laying down. Usually her pain is dull but the shocking pain was new.  Shock-like pain lasted less than 1 minute. LMP around 04/03/20 but she is unsure of the date. [facility]. Pt is excited that she is pregnant.  She was told she is around 12 weeks via ultrasound. Denies vaginal bleeding, contractions and loss of fluid.  Endorses morning sickness. She has a 20 yo female child named Piper that she delivered by herself at home. She denies similar pain in previous pregnancy.     PERTINENT  PMH / PSH: reviewed and updated as appropriate   OBJECTIVE:   BP 130/72   Pulse 73   Wt 234 lb 6.4 oz (106.3 kg)   LMP  (LMP Unknown)   SpO2 97%   BMI 47.34 kg/m    GEN: smiling, well developed female in no acute distress RESP: Breathing comfortably on room air, no increased work of breathing ABD: obese abdomen, non-tender, non-distended  GU: gravid  Skin: Well perfused  ASSESSMENT/PLAN:   First trimester pregnancy Urine pregnancy test positive.  Initial prenatal labs drawn.  Discussed genetic testing with patient.  Patient consented to testing.  Nuchal translucency ultrasound ordered.  Doxylamine pyridoxine at bedtime for morning sickness.   [facility]  Follow up in 1 week for initial OB appointment   Pelvic pain during pregnancy No red flag sx identified. Pelvic pain possibly early onset lightning crotch. IF not resolved consider repeat US.        Katha Cabal, DO PGY-2, [facility] 07/11/2020

## 2020-07-12 LAB — HCV AB W REFLEX TO QUANT PCR: HCV Ab: <0.1 s/co ratio (ref 0.0–0.9)

## 2020-07-12 LAB — OBSTETRIC PANEL, INCLUDING HIV
Antibody Screen: NEGATIVE
Basophils Absolute: 0 10*3/uL (ref 0.0–0.2)
Basos: 0 %
EOS (ABSOLUTE): 0 10*3/uL (ref 0.0–0.4)
Eos: 0 %
HIV Screen 4th Generation wRfx: NONREACTIVE
Hematocrit: 40.2 % (ref 34.0–46.6)
Hemoglobin: 13.5 g/dL (ref 11.1–15.9)
Hepatitis B Surface Ag: NEGATIVE
Immature Grans (Abs): 0 10*3/uL (ref 0.0–0.1)
Immature Granulocytes: 0 %
Lymphocytes Absolute: 2.3 10*3/uL (ref 0.7–3.1)
Lymphs: 22 %
MCH: 29.5 pg (ref 26.6–33.0)
MCHC: 33.6 g/dL (ref 31.5–35.7)
MCV: 88 fL (ref 79–97)
Monocytes Absolute: 0.5 10*3/uL (ref 0.1–0.9)
Monocytes: 5 %
Neutrophils Absolute: 7.2 10*3/uL — ABNORMAL HIGH (ref 1.4–7.0)
Neutrophils: 73 %
Platelets: 308 10*3/uL (ref 150–450)
RBC: 4.57 x10E6/uL (ref 3.77–5.28)
RDW: 13 % (ref 11.7–15.4)
RPR Ser Ql: NONREACTIVE
Rh Factor: POSITIVE
Rubella Antibodies, IGG: 2.96 index (ref >0.99)
WBC: 10.1 10*3/uL (ref 3.4–10.8)

## 2020-07-12 LAB — HCV INTERPRETATION

## 2020-07-12 LAB — VARICELLA ZOSTER ANTIBODY, IGG: Varicella zoster IgG: 281 index (ref >165)

## 2020-07-13 LAB — URINE CULTURE, OB REFLEX

## 2020-07-13 LAB — CULTURE, OB URINE

## 2020-07-15 NOTE — L&D Delivery Note (Signed)
OB/GYN Faculty Practice Delivery Note  Alice Gibson is a 20 y.o. [id number] s/p vaginal delivery at [redacted]w[redacted]d. She was admitted for IOL secondary to IUGR.   ROM: 1h 12m with light meconium stained fluid GBS Status: negative Maximum Maternal Temperature: 99.19F  Labor Progress: Pt received cytotec and FB on admission. She was transitioned to pitocin and then had SROM for light meconium stained fluid at 0953 on 01/05/21. She then progressed to complete cervical dilation with up-titration of pitocin and had an uncomplicated delivery as noted below.  Delivery Date/Time: 01/05/21 at 1105 Delivery: Called to room and patient was complete and pushing. Head delivered ROA. Tight nuchal cord present x2; reduced s/p delivery. Shoulder and body delivered in usual fashion. Infant with spontaneous cry, placed on mother's abdomen, dried and stimulated. Cord clamped x 2 after 30 second delay; and cut by nurse under my direct supervision. Cord blood drawn. Placenta delivered spontaneously with gentle cord traction. Fundus firm with massage and Pitocin. Labia, perineum, vagina, and cervix were inspected, notable for hemostatic right-sided peri-urethral laceration without need for repair.  Placenta: 3-vessel cord, intact, sent to L&D Complications: none Lacerations: hemostatic right-sided peri-urethral laceration without need for repair EBL: 50 ml Analgesia: none  Infant: viable female  APGARs 8 & 9  weight 2750 g  Lynnda Shields, MD OB/GYN Fellow, Faculty Practice

## 2020-07-18 ENCOUNTER — Encounter: Payer: Self-pay | Admitting: Family Medicine

## 2020-07-18 ENCOUNTER — Ambulatory Visit (INDEPENDENT_AMBULATORY_CARE_PROVIDER_SITE_OTHER): Payer: Medicaid Other | Admitting: Family Medicine

## 2020-07-18 ENCOUNTER — Other Ambulatory Visit: Payer: Self-pay

## 2020-07-18 VITALS — BP 120/80 | HR 89 | Wt 233.0 lb

## 2020-07-18 DIAGNOSIS — Z3481 Encounter for supervision of other normal pregnancy, first trimester: Secondary | ICD-10-CM

## 2020-07-18 NOTE — Progress Notes (Addendum)
Patient Name: Alice Gibson Date of Birth: 2001-05-08 [facility] Initial Prenatal Visit  Alice Gibson is a 20 y.o. year old G3P1 at Unknown who presents for her initial prenatal visit. Pregnancy is planned.  She reports breast tenderness, fatigue, frequent urination, morning sickness, nausea and positive home pregnancy test. She is taking a prenatal vitamin.  She denies pelvic pain or vaginal bleeding.   Pregnancy Dating: . The patient is dated by LMP as [redacted]w[redacted]d. Marland Kitchen LMP: 04/24/20. EDD 01/29/21  . Period is certain:  No.  . Periods were regular:  Yes.  Marland Kitchen LMP was a typical period:  Yes.  . Using hormonal contraception in 3 months prior to conception: No  Lab Review: . Blood type: A . Rh Status: + . Antibody screen: Negative . HIV: Negative . RPR: Negative . Hemoglobin electrophoresis reviewed: No. No drawn at last visit. Will get today.  Marland Kitchen Results of OB urine culture are: Negative . Rubella: Immune . Varicella status is Immune  PMH: Reviewed and as detailed below: . HTN: No  . Type 1 or 2 Diabetes: No  . Depression and Anxiety:  Yes; no meds   . Seizure disorder:  Yes. Had hx when she was a kid. Last sz was when she was 93-7 yo. Followed with neurology. Stopped taking medication 6-7 yo.  Marland Kitchen VTE: No ,  . History of STI No,  . Abnormal Pap smear:  NA; age . Genital herpes simplex:  No   PSH: . Gynecologic Surgery:  no . Surgical history reviewed, notable for: none  Obstetric History: . Obstetric history tab updated and reviewed.  . Summary of prior pregnancies: G3P1011  . Cesarean delivery: No  . Gestational Diabetes:  No . Hypertension in pregnancy: No . History of preterm birth: No . History of LGA/SGA infant:  No . History of shoulder dystocia: No . Indications for referral were reviewed, and the patient has no obstetric indications for referral to [facility] at this time.   Social History: . Partner's name: Alice Gibson  . Tobacco  use: No . Alcohol use:  No . Other substance use:  Yes: Marijuana about 2 weeks   Current Medications:  . Kenalog, albuterol, Zyrtec, Doxylamine-pyridoxine  . Reviewed and appropriate in pregnancy.   Genetic and Infection Screen: . Flow Sheet Updated Yes  Prenatal Exam: Gen: Well nourished, well developed.  No distress.  Vitals noted. HEENT: Normocephalic, atraumatic.  Neck supple without cervical lymphadenopathy, thyromegaly or thyroid nodules.  Fair dentition. CV: RRR no murmur, gallops or rubs Lungs: CTA B.  Normal respiratory effort without wheezes or rales. Abd: soft, NTND. +BS.  Uterus not appreciated above pelvis. GU: Deferred  Ext: No clubbing, cyanosis or edema. Psych: Normal grooming and dress.  Not depressed or anxious appearing.  Normal thought content and process without flight of ideas or looseness of associations  Fetal heart tones: Appropriate  Assessment/Plan:  Alice Gibson is a 20 y.o. G3P1 at Unknown who presents to initiate prenatal care. She is doing well.   Current pregnancy issues include obesity, marijuana use in pregnancy.  1. Routine prenatal care: Marland Kitchen As dating is not reliable, a dating ultrasound has been ordered. Dating tab updated. . Pre-pregnancy weight updated. Expected weight gain this pregnancy is 11-20 pounds  . Prenatal labs reviewed, notable for none. . Indications for referral to [facility] were reviewed and the patient does not meet criteria for referral.  . Medication list reviewed and  updated.  . Recommended patient see a dentist for regular care.  . Bleeding and pain precautions reviewed. . Importance of prenatal vitamins reviewed.  . Genetic screening offered. Patient opted for: first trimester screen with nuchal translucency at 11-13 weeks or Cell-Free DNA screening. . The patient will not be age 35 or over at time of delivery. Referral to genetic counseling was not offered today.  . The patient has the following risk factors for  preexisting diabetes: BMI > 25 and sedentary lifestyle. An early 1 hour glucose tolerance test was not ordered but should be ordered at next visit. . Pregnancy Medical Home and PHQ-9 forms completed, problems noted: No  . Patient has a cat.  Discussed not changing the cat litter.  Briefly discussed consequences of her mom and baby.  2.   Marijuana use . Patient reported marijuana use to help with nausea and vomiting.  Reports she has not smoked anymore marijuana in the past 2 weeks.  Does not plan to continue smoking marijuana during her pregnancy.  Advised patient on the dangers of marijuana use both in her and her baby.  3.   History of seizure disorder . Has not been on medication for the past 13 years or so.  Denies seizure activity since then.  May benefit from additional B6 and folate.  Consider neurology referral.  Follow up 2 weeks for next prenatal visit.  She will need pelvic exam 1 hour GTT.

## 2020-07-18 NOTE — Progress Notes (Signed)
Prenatal

## 2020-07-18 NOTE — Patient Instructions (Addendum)
It was great seeing you today!  Please check-out at the front desk before leaving the clinic. I'd like to see you back in 2 weeks but if you need to be seen earlier than that for any new issues we're happy to fit you in, just give [place] a call!  Visit Remembers: - Continue taking prenatal vitamins   I will call you later today regarding your ultrasound date and time.      Regarding lab work today:  Due to recent changes in healthcare laws, you may see the results of your imaging and laboratory studies on MyChart before your provider has had a chance to review them.  I understand that in some cases there may be results that are confusing or concerning to you. Not all laboratory results come back in the same time frame and you may be waiting for multiple results in order to interpret others.  Please give [place] 72 hours in order for your provider to thoroughly review all the results before contacting the office for clarification of your results. If everything is normal, you will get a letter in the mail or a message in My Chart. Please give [place] a call if you do not hear from [place] after 2 weeks.  Please bring all of your medications with you to each visit.    If you haven't already, sign up for My Chart to have easy access to your labs results, and communication with your primary care physician.  Feel free to call with any questions or concerns at any time, at [phone].   Take care,  Dr. Kather[facility]   First Trimester of Pregnancy The first trimester of pregnancy is from week 1 until the end of week 13 (months 1 through 3). A week after a sperm fertilizes an egg, the egg will implant on the wall of the uterus. This embryo will begin to develop into a baby. Genes from you and your partner will form the baby. The female genes will determine whether the baby will be a boy or a girl. At 6-8 weeks, the eyes and face will be formed, and the heartbeat can be seen on ultrasound.  At the end of 12 weeks, all the baby's organs will be formed. Now that you are pregnant, you will want to do everything you can to have a healthy baby. Two of the most important things are to get good prenatal care and to follow your health care provider's instructions. Prenatal care is all the medical care you receive before the baby's birth. This care will help prevent, find, and treat any problems during the pregnancy and childbirth. Body changes during your first trimester Your body goes through many changes during pregnancy. The changes vary from woman to woman.  You may gain or lose a couple of pounds at first.  You may feel sick to your stomach (nauseous) and you may throw up (vomit). If the vomiting is uncontrollable, call your health care provider.  You may tire easily.  You may develop headaches that can be relieved by medicines. All medicines should be approved by your health care provider.  You may urinate more often. Painful urination may mean you have a bladder infection.  You may develop heartburn as a result of your pregnancy.  You may develop constipation because certain hormones are causing the muscles that push stool through your intestines to slow down.  You may develop hemorrhoids or swollen veins (varicose veins).  Your breasts may  begin to grow larger and become tender. Your nipples may stick out more, and the tissue that surrounds them (areola) may become darker.  Your gums may bleed and may be sensitive to brushing and flossing.  Dark spots or blotches (chloasma, mask of pregnancy) may develop on your face. This will likely fade after the baby is born.  Your menstrual periods will stop.  You may have a loss of appetite.  You may develop cravings for certain kinds of food.  You may have changes in your emotions from day to day, such as being excited to be pregnant or being concerned that something may go wrong with the pregnancy and baby.  You may have more  vivid and strange dreams.  You may have changes in your hair. These can include thickening of your hair, rapid growth, and changes in texture. Some women also have hair loss during or after pregnancy, or hair that feels dry or thin. Your hair will most likely return to normal after your baby is born. What to expect at prenatal visits During a routine prenatal visit:  You will be weighed to make sure you and the baby are growing normally.  Your blood pressure will be taken.  Your abdomen will be measured to track your baby's growth.  The fetal heartbeat will be listened to between weeks 10 and 14 of your pregnancy.  Test results from any previous visits will be discussed. Your health care provider may ask you:  How you are feeling.  If you are feeling the baby move.  If you have had any abnormal symptoms, such as leaking fluid, bleeding, severe headaches, or abdominal cramping.  If you are using any tobacco products, including cigarettes, chewing tobacco, and electronic cigarettes.  If you have any questions. Other tests that may be performed during your first trimester include:  Blood tests to find your blood type and to check for the presence of any previous infections. The tests will also be used to check for low iron levels (anemia) and protein on red blood cells (Rh antibodies). Depending on your risk factors, or if you previously had diabetes during pregnancy, you may have tests to check for high blood sugar that affects pregnant women (gestational diabetes).  Urine tests to check for infections, diabetes, or protein in the urine.  An ultrasound to confirm the proper growth and development of the baby.  Fetal screens for spinal cord problems (spina bifida) and Down syndrome.  HIV (human immunodeficiency virus) testing. Routine prenatal testing includes screening for HIV, unless you choose not to have this test.  You may need other tests to make sure you and the baby are  doing well. Follow these instructions at home: Medicines  Follow your health care provider's instructions regarding medicine use. Specific medicines may be either safe or unsafe to take during pregnancy.  Take a prenatal vitamin that contains at least 600 micrograms (mcg) of folic acid.  If you develop constipation, try taking a stool softener if your health care provider approves. Eating and drinking   Eat a balanced diet that includes fresh fruits and vegetables, whole grains, good sources of protein such as meat, eggs, or tofu, and low-fat dairy. Your health care provider will help you determine the amount of weight gain that is right for you.  Avoid raw meat and uncooked cheese. These carry germs that can cause birth defects in the baby.  Eating four or five small meals rather than three large meals a day may  help relieve nausea and vomiting. If you start to feel nauseous, eating a few soda crackers can be helpful. Drinking liquids between meals, instead of during meals, also seems to help ease nausea and vomiting.  Limit foods that are high in fat and processed sugars, such as fried and sweet foods.  To prevent constipation: ? Eat foods that are high in fiber, such as fresh fruits and vegetables, whole grains, and beans. ? Drink enough fluid to keep your urine clear or pale yellow. Activity  Exercise only as directed by your health care provider. Most women can continue their usual exercise routine during pregnancy. Try to exercise for 30 minutes at least 5 days a week. Exercising will help you: ? Control your weight. ? Stay in shape. ? Be prepared for labor and delivery.  Experiencing pain or cramping in the lower abdomen or lower back is a good sign that you should stop exercising. Check with your health care provider before continuing with normal exercises.  Try to avoid standing for long periods of time. Move your legs often if you must stand in one place for a long  time.  Avoid heavy lifting.  Wear low-heeled shoes and practice good posture.  You may continue to have sex unless your health care provider tells you not to. Relieving pain and discomfort  Wear a good support bra to relieve breast tenderness.  Take warm sitz baths to soothe any pain or discomfort caused by hemorrhoids. Use hemorrhoid cream if your health care provider approves.  Rest with your legs elevated if you have leg cramps or low back pain.  If you develop varicose veins in your legs, wear support hose. Elevate your feet for 15 minutes, 3-4 times a day. Limit salt in your diet. Prenatal care  Schedule your prenatal visits by the twelfth week of pregnancy. They are usually scheduled monthly at first, then more often in the last 2 months before delivery.  Write down your questions. Take them to your prenatal visits.  Keep all your prenatal visits as told by your health care provider. This is important. Safety  Wear your seat belt at all times when driving.  Make a list of emergency phone numbers, including numbers for family, friends, the hospital, and police and fire departments. General instructions  Ask your health care provider for a referral to a local prenatal education class. Begin classes no later than the beginning of month 6 of your pregnancy.  Ask for help if you have counseling or nutritional needs during pregnancy. Your health care provider can offer advice or refer you to specialists for help with various needs.  Do not use hot tubs, steam rooms, or saunas.  Do not douche or use tampons or scented sanitary pads.  Do not cross your legs for long periods of time.  Avoid cat litter boxes and soil used by cats. These carry germs that can cause birth defects in the baby and possibly loss of the fetus by miscarriage or stillbirth.  Avoid all smoking, herbs, alcohol, and medicines not prescribed by your health care provider. Chemicals in these products affect  the formation and growth of the baby.  Do not use any products that contain nicotine or tobacco, such as cigarettes and e-cigarettes. If you need help quitting, ask your health care provider. You may receive counseling support and other resources to help you quit.  Schedule a dentist appointment. At home, brush your teeth with a soft toothbrush and be gentle when you floss.  Contact a health care provider if:  You have dizziness.  You have mild pelvic cramps, pelvic pressure, or nagging pain in the abdominal area.  You have persistent nausea, vomiting, or diarrhea.  You have a bad smelling vaginal discharge.  You have pain when you urinate.  You notice increased swelling in your face, hands, legs, or ankles.  You are exposed to fifth disease or chickenpox.  You are exposed to [place] measles (rubella) and have never had it. Get help right away if:  You have a fever.  You are leaking fluid from your vagina.  You have spotting or bleeding from your vagina.  You have severe abdominal cramping or pain.  You have rapid weight gain or loss.  You vomit blood or material that looks like coffee grounds.  You develop a severe headache.  You have shortness of breath.  You have any kind of trauma, such as from a fall or a car accident. Summary  The first trimester of pregnancy is from week 1 until the end of week 13 (months 1 through 3).  Your body goes through many changes during pregnancy. The changes vary from woman to woman.  You will have routine prenatal visits. During those visits, your health care provider will examine you, discuss any test results you may have, and talk with you about how you are feeling. This information is not intended to replace advice given to you by your health care provider. Make sure you discuss any questions you have with your health care provider. Document Revised: 06/13/2017 Document Reviewed: 06/12/2016 Elsevier Patient Education  2020  [employer].

## 2020-07-19 ENCOUNTER — Encounter: Payer: Self-pay | Admitting: Family Medicine

## 2020-07-20 LAB — HGB FRACTIONATION CASCADE
Hgb A2: 2.8 % (ref 1.8–3.2)
Hgb A: 97.2 % (ref 96.4–98.8)
Hgb F: 0 % (ref 0.0–2.0)
Hgb S: 0 %

## 2020-07-25 ENCOUNTER — Ambulatory Visit
Admission: RE | Admit: 2020-07-25 | Discharge: 2020-07-25 | Disposition: A | Discharge status: Home / Self Care | Payer: Medicaid Other | Source: Ambulatory Visit | Attending: Family Medicine | Admitting: Family Medicine

## 2020-07-25 ENCOUNTER — Other Ambulatory Visit: Payer: Self-pay

## 2020-07-25 DIAGNOSIS — Z3481 Encounter for supervision of other normal pregnancy, first trimester: Secondary | ICD-10-CM | POA: Insufficient documentation

## 2020-07-26 ENCOUNTER — Other Ambulatory Visit: Payer: Medicaid Other

## 2020-07-26 DIAGNOSIS — Z20822 Contact with and (suspected) exposure to covid-19: Secondary | ICD-10-CM

## 2020-07-27 ENCOUNTER — Ambulatory Visit
Admission: EM | Admit: 2020-07-27 | Discharge: 2020-07-27 | Disposition: A | Discharge status: Home / Self Care | Payer: Medicaid Other | Attending: Emergency Medicine | Admitting: Emergency Medicine

## 2020-07-27 ENCOUNTER — Other Ambulatory Visit: Payer: Self-pay

## 2020-07-27 DIAGNOSIS — J069 Acute upper respiratory infection, unspecified: Secondary | ICD-10-CM

## 2020-07-27 DIAGNOSIS — Z1152 Encounter for screening for COVID-19: Secondary | ICD-10-CM | POA: Diagnosis not present

## 2020-07-27 MED ORDER — DEXTROMETHORPHAN-GUAIFENESIN 5-100 MG/5ML PO LIQD: 5.0000 mL | Freq: Three times a day (TID) | ORAL | 0 refills | Status: DC | PRN

## 2020-07-27 NOTE — ED Triage Notes (Signed)
Patient states she has had a sore throat, cough, congestion, and nausea/vomiting x 2 days. Pt is currently pregnant at [redacted] weeks. Pt is aox4 and ambulatory.

## 2020-07-27 NOTE — Discharge Instructions (Signed)
COVID test pending, monitor MyChart for results Rest and fluids Tylenol for fevers body aches headaches, chest discomfort Please use list provided of over-the-counter medicine safe to use in pregnancy I sent in robitussin DM with cough Follow-up if not improving or worsening

## 2020-07-27 NOTE — ED Provider Notes (Signed)
[name]    CSN: [id number] Arrival date & time: 07/27/20  7322      History   Chief Complaint Chief Complaint  Patient presents with  . Sore Throat    Since yesterday  . Cough    Since yesterday  . Emesis    X 2 episodes today    HPI Alice Gibson is a 20 y.o. female approximately [redacted] weeks pregnant presenting today for evaluation of URI symptoms.  Reports that she has had cough congestion and sore throat for approximately 2 days.  Has had associated nausea and vomiting as well, patient mainly spitting up mucus, denies vomiting stomach contents.  Denies any known COVID exposures.  HPI  Past Medical History:  Diagnosis Date  . Abnormal urination 04/26/2014  . Allergic rhinitis due to cats 05/02/2016  . Allergic to animal dander 05/02/2016   Equivocal reactivity to Cat and to Dog danders on skin-prick testing by Dr Willa Rough (Allergist, 01/2008)  . Allergy to mold 05/02/2016   Equivocal reactivity to mold on skin-prick testing by Dr Willa Rough (Allergist, 01/2008)  . Allergy to pollen 05/02/2016   Strong allergic response to skin-prick testing ( Dr Willa Rough (Allergist) 01/2008) to tree pollens  . 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 Asthma   . Asthma, mild persistent 03/12/2007   Qualifier: Diagnosis of  By: Humberto Seals NP, Darl Pikes     . ASTHMA, PERSISTENT 03/12/2007   Qualifier: Diagnosis of  By: Humberto Seals NP, Darl Pikes     . Back pain 05/18/2013  . CHILDHOOD OBESITY 10/27/2008   Qualifier: Diagnosis of  By: McDiarmid MD, Tawanna Cooler    . Constipation 10/05/2013  . DERMATITIS, ATOPIC 12/18/2007   Qualifier: Diagnosis of  By: McDiarmid MD, Tawanna Cooler    . Disordered sleep 12/02/2016  . Disordered sleep 12/02/2016  . Eczema 11/28/2015  . Exercise-induced asthma 05/02/2016  . Expressive language disorder 07/30/2010   Qualifier: Diagnosis of  By: McDiarmid MD, Tawanna Cooler    . 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 History of atopic dermatitis 12/18/2007    History of atopic dermatitis as child.     Marland Kitchen History of atopic dermatitis 12/18/2007   History of atopic dermatitis as child.     Marland Kitchen History of major depression 04/27/2015  . 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 Major depressive disorder in remission (HCC) 07/21/2015  . Major depressive disorder in remission (HCC) 07/21/2015  . MDD (major depressive disorder), recurrent episode, severe (HCC) 04/27/2015  . Obesity 10/27/2008   Qualifier: Diagnosis of  By: McDiarmid MD, Tawanna Cooler    . Otalgia of right ear 07/21/2015  . Peanut allergy 10/05/2013  . Scoliosis, adolescent acquired 09/11/2015  . SEIZURE DISORDER, HX OF 04/02/2007   Qualifier: Diagnosis of  By: McDiarmid MD, Tawanna Cooler    . Seizures (HCC)   . Severe episode of recurrent major depressive disorder, without psychotic features (HCC)   . Smoking 07/21/2015  . Superficial acne vulgaris 05/02/2016  . Syncope 07/21/2015    Patient Active Problem List   Diagnosis Date Noted  . First trimester pregnancy 07/11/2020  . Pelvic pain during pregnancy 07/11/2020  . Keloid 05/06/2019  . Dysmenorrhea 03/16/2019  . Chronic vomiting 03/16/2019  . Quit smoking 03/15/2019  . Numbness and tingling of left side of face 11/24/2018  . Possible IBS (irritable bowel  syndrome) 05/29/2018  . Chronic pain of left knee 11/27/2017  . Atopic dermatitis 05/02/2016  . Exercise-induced asthma 05/02/2016  . Eczema 11/28/2015  . Right ear pain 07/21/2015  . Morbid obesity (HCC) 10/27/2008  . Asthma, mild persistent 03/12/2007    Past Surgical History:  Procedure Laterality Date  . NO PAST SURGERIES      OB History    Gravida  3   Gibson  1   Term      Preterm      AB  1   Living  1     SAB  1   IAB      Ectopic      Multiple  0   Live Births  1            Home Medications    Prior to Admission medications   Medication Sig Start Date End Date Taking?  Authorizing Provider  albuterol (VENTOLIN HFA) 108 (90 Base) MCG/ACT inhaler Inhale 2 puffs into the lungs every 6 (six) hours as needed for wheezing or shortness of breath. 10/25/19  Yes McDiarmid, Leighton Roachodd D, MD  cetirizine (ZYRTEC) 10 MG tablet Take 1 tablet (10 mg total) by mouth daily. 10/25/19  Yes McDiarmid, Leighton Roachodd D, MD  Dextromethorphan-guaiFENesin 5-100 MG/5ML LIQD Take 5-10 mLs by mouth every 8 (eight) hours as needed. 07/27/20  Yes Wieters, Hallie C, PA-C  Doxylamine-Pyridoxine 10-10 MG TBEC Take 10 mg by mouth at bedtime. 07/11/20  Yes Brimage, Vondra, DO  triamcinolone cream (KENALOG) 0.1 % Apply 1 application topically 2 (two) times daily. 10/25/19   McDiarmid, Leighton Roachodd D, MD    Family History Family History  Problem Relation Age of Onset  . Alcohol abuse Mother 1020  . Drug abuse Mother 5020  . Mental illness Mother 1520  . Clotting disorder Mother 7840       Hypercoagulopathy undefined but causing large aortic thromboemboli  . Kidney disease Father   . Diabetes Maternal Grandmother   . Colon cancer Neg Hx   . Esophageal cancer Neg Hx   . Pancreatic cancer Neg Hx   . Stomach cancer Neg Hx     Social History Social History   Tobacco Use  . Smoking status: Former Smoker    Types: Cigarettes  . Smokeless tobacco: Never Used  Vaping Use  . Vaping Use: Never used  Substance Use Topics  . Alcohol use: Not Currently    Comment: occassionally  . Drug use: Not Currently    Types: Marijuana     Allergies   Valproic acid   Review of Systems Review of Systems  Constitutional: Negative for activity change, appetite change, chills, fatigue and fever.  HENT: Positive for congestion and rhinorrhea. Negative for ear pain, sinus pressure, sore throat and trouble swallowing.   Eyes: Negative for discharge and redness.  Respiratory: Positive for cough. Negative for chest tightness and shortness of breath.   Cardiovascular: Negative for chest pain.  Gastrointestinal: Positive for nausea.  Negative for abdominal pain, diarrhea and vomiting.  Musculoskeletal: Negative for myalgias.  Skin: Negative for rash.  Neurological: Negative for dizziness, light-headedness and headaches.     Physical Exam Triage Vital Signs ED Triage Vitals  Enc Vitals Group     BP 07/27/20 0908 114/81     Pulse Rate 07/27/20 0908 (!) 122     Resp 07/27/20 0908 18     Temp 07/27/20 0908 98.3 F (36.8 C)     Temp Source 07/27/20 0908 Oral  SpO2 07/27/20 0908 98 %     Weight --      Height --      Head Circumference --      Peak Flow --      Pain Score 07/27/20 0911 6     Pain Loc --      Pain Edu? --      Excl. in GC? --    No data found.  Updated Vital Signs BP 114/81 (BP Location: Left Arm)   Pulse (!) 122   Temp 98.3 F (36.8 C) (Oral)   Resp 18   LMP 04/24/2020 (Within Weeks)   SpO2 98%   Visual Acuity Right Eye Distance:   Left Eye Distance:   Bilateral Distance:    Right Eye Near:   Left Eye Near:    Bilateral Near:     Physical Exam Vitals and nursing note reviewed.  Constitutional:      Appearance: She is well-developed and well-nourished.     Comments: No acute distress  HENT:     Head: Normocephalic and atraumatic.     Ears:     Comments: Bilateral ears without tenderness to palpation of external auricle, tragus and mastoid, EAC's without erythema or swelling, TM's with good bony landmarks and cone of light. Non erythematous.     Nose: Nose normal.     Mouth/Throat:     Comments: Oral mucosa pink and moist, no tonsillar enlargement or exudate. Posterior pharynx patent and nonerythematous, no uvula deviation or swelling. Normal phonation. Eyes:     Conjunctiva/sclera: Conjunctivae normal.  Cardiovascular:     Rate and Rhythm: Tachycardia present.  Pulmonary:     Effort: Pulmonary effort is normal. No respiratory distress.     Comments: Breathing comfortably at rest, CTABL, no wheezing, rales or other adventitious sounds auscultated Abdominal:      General: There is no distension.  Musculoskeletal:        General: Normal range of motion.     Cervical back: Neck supple.  Skin:    General: Skin is warm and dry.  Neurological:     Mental Status: She is alert and oriented to person, place, and time.  Psychiatric:        Mood and Affect: Mood and affect normal.      UC Treatments / Results  Labs (all labs ordered are listed, but only abnormal results are displayed) Labs Reviewed  NOVEL CORONAVIRUS, NAA    EKG   Radiology US OB Comp Less 14 Wks  Result Date: 07/25/2020 CLINICAL DATA:  Encounter for supervision of normal first trimester pregnancy; EGA [redacted] weeks 1 day by LMP of 04/24/2020 EXAM: OBSTETRIC <14 WK ULTRASOUND TECHNIQUE: Transabdominal ultrasound was performed for evaluation of the gestation as well as the maternal uterus and adnexal regions. COMPARISON:  None for this gestation FINDINGS: Intrauterine gestational sac: Present, single Yolk sac:  Not visualized Embryo:  Present Cardiac Activity: Present Heart Rate: 150 bpm CRL:   95.2 mm   15 w 2 d                  [place] EDC: 01/14/2021 Subchorionic hemorrhage:  None visualized. Maternal uterus/adnexae: Uterus anteverted with developing placenta anterior in position. RIGHT ovary normal size and morphology 3.1 x 2.0 x 2.8 cm. LEFT ovary normal size and morphology 3.6 x 1.9 x 2.7 cm. No free pelvic fluid or adnexal masses. Fetal stomach and bladder were visualized. Additional measurements were obtained: BPD 3.06 cm 15 weeks 5  days HC: 11.0 cm 15 weeks 2 days AC: 8.78 cm 15 weeks 0 days FL: 1.55 cm 14 weeks 4 days IMPRESSION: Single live intrauterine gestation at 15 weeks 2 days EGA by crown-rump length. Electronically Signed   By: Ulyses Southward M.D.   On: 07/25/2020 16:40    Procedures Procedures (including critical care time)  Medications Ordered in UC Medications - No data to display  Initial Impression / Assessment and Plan / UC Course  I have reviewed the triage vital signs  and the nursing notes.  Pertinent labs & imaging results that were available during my care of the patient were reviewed by me and considered in my medical decision making (see chart for details).     Viral URI with cough-lungs clear to auscultation, suspect likely viral etiology and recommending symptomatic and supportive care.  Push fluids.  Provided list of medicines safe in pregnancy, encouraged to keep in close contact with OB/GYN as well.  Discussed strict return precautions. Patient verbalized understanding and is agreeable with plan.  Final Clinical Impressions(s) / UC Diagnoses   Final diagnoses:  Encounter for screening for COVID-19  Viral URI with cough     Discharge Instructions     COVID test pending, monitor MyChart for results Rest and fluids Tylenol for fevers body aches headaches, chest discomfort Please use list provided of over-the-counter medicine safe to use in pregnancy I sent in robitussin DM with cough Follow-up if not improving or worsening    ED Prescriptions    Medication Sig Dispense Auth. Provider   Dextromethorphan-guaiFENesin 5-100 MG/5ML LIQD Take 5-10 mLs by mouth every 8 (eight) hours as needed. 118 mL Wieters, Cobb C, PA-C     PDMP not reviewed this encounter.   Wieters, [place] C, PA-C 07/27/20 1010

## 2020-07-28 LAB — SARS-COV-2, NAA 2 DAY TAT

## 2020-07-28 LAB — NOVEL CORONAVIRUS, NAA: SARS-CoV-2, NAA: NOT DETECTED

## 2020-07-29 LAB — NOVEL CORONAVIRUS, NAA: SARS-CoV-2, NAA: NOT DETECTED

## 2020-07-29 LAB — SARS-COV-2, NAA 2 DAY TAT

## 2020-08-01 ENCOUNTER — Encounter: Payer: Medicaid Other | Admitting: Family Medicine

## 2020-08-06 NOTE — Progress Notes (Signed)
  Patient Name: [name] Date of Birth: 2000/12/07 [facility] Prenatal Visit  Alice Gibson is a 20 y.o. G3P0011 at [redacted]w[redacted]d here for routine follow up. She is dated by midtrimester ultrasound.  She reports no complaints.  She denies vaginal bleeding.  See flow sheet for details.  Vitals:   08/07/20 1354  BP: 122/80  Pulse: (!) 104     A/P: Pregnancy at [redacted]w[redacted]d.  Doing well.    1. Routine Prenatal Care:  Marland Kitchen Dating reviewed, dating tab is incorrect, discussed with preceptor and patient, updated appropriately.  . Fetal heart tones Appropriate . Influenza vaccine previously administered.   Marland Kitchen COVID vaccination was discussed and patient recevied 2 shot pfizer series in October and November 2021; eligible for third shot in May 2022. . The patient has the following indication for screening preexisting diabetes: BMI > 25 and high risk ethnicity (Latino, [place] American, Native American, [place] Islander, Asian Naval architect) . Early 1hr GTT scheduled for 08/31/20. . Anatomy ultrasound ordered to be scheduled at 18-20 weeks. . Patient is interested in genetic screening. As she is past 13 weeks and 6 days, a Quad screen  was offered.  . Pregnancy education including expected weight gain in pregnancy, OTC medication use, continued use of prenatal vitamin, smoking cessation if applicable, and nutrition in pregnancy.   . Bleeding and pain precautions reviewed.  2. Pregnancy issues include the following and were addressed as appropriate today:  . Dating: patient had incorrect dating as LMP was uncertain. [place] on 07/25/20 was [redacted]w[redacted]d with EDC of 01/14/21. This is new working EDD and updated appropriately in the chart.  . Discussed contraception extensively, patient will continue to consider options . Patient delivered first pregnancy at home as she was a teenager and in denial about pregnancy. Discussed labor s/sx extensively including RTC precautions.  . Discussed safe medications, healthy  diet, exercise, and prenatal vitamins . Breastfeeding/parenting classes offered . Pelvic exam completed and GC/CT collected . Problem list  and pregnancy box updated: Yes.   Follow up 4 weeks.

## 2020-08-06 NOTE — Patient Instructions (Addendum)
It was a pleasure to see you today!  1. Go to the MAU at [facility] if:  You have cramping/contractions that do not go away with drinking water  Your water breaks.  Sometimes it is a big gush of fluid, sometimes it is just a trickle that keeps getting your underwear wet or running down your legs  You have vaginal bleeding.     You do not feel your baby moving like normal.  If you do not, get something to eat and drink and lay down and focus on feeling your baby move. If your baby is still not moving like normal, you should go to MAU.  2. Consider what birth control you would like to have after this baby is born  47. For childbirth and breastfeeding classes: [email or web address]  4. Follow up on 08/31/20 at 9:15 AM for a glucose tolerance test: remember to fast! Then you have an appointment at 9:30 AM afterward.  5. For your anatomy ultrasound, my assistant will help schedule this and let you know the date.  6. For quad screen labs: we will let you know results in about 2-3 weeks or at your next appointment, whichever comes first when results have returned.  Be Well,  Dr. Leary Roca

## 2020-08-07 ENCOUNTER — Other Ambulatory Visit: Payer: Self-pay

## 2020-08-07 ENCOUNTER — Ambulatory Visit (INDEPENDENT_AMBULATORY_CARE_PROVIDER_SITE_OTHER): Payer: Medicaid Other | Admitting: Family Medicine

## 2020-08-07 ENCOUNTER — Other Ambulatory Visit (HOSPITAL_COMMUNITY)
Admission: RE | Admit: 2020-08-07 | Discharge: 2020-08-07 | Disposition: A | Discharge status: Home / Self Care | Payer: Medicaid Other | Source: Ambulatory Visit | Attending: Family Medicine | Admitting: Family Medicine

## 2020-08-07 VITALS — BP 122/80 | HR 104 | Wt 229.6 lb

## 2020-08-07 DIAGNOSIS — Z113 Encounter for screening for infections with a predominantly sexual mode of transmission: Secondary | ICD-10-CM | POA: Diagnosis present

## 2020-08-07 DIAGNOSIS — Z3482 Encounter for supervision of other normal pregnancy, second trimester: Secondary | ICD-10-CM

## 2020-08-09 LAB — CERVICOVAGINAL ANCILLARY ONLY
Chlamydia: NEGATIVE
Comment: NEGATIVE
Comment: NORMAL
Neisseria Gonorrhea: NEGATIVE

## 2020-08-22 ENCOUNTER — Other Ambulatory Visit: Payer: Self-pay | Admitting: *Deleted

## 2020-08-22 ENCOUNTER — Other Ambulatory Visit (HOSPITAL_COMMUNITY): Payer: Self-pay | Admitting: Obstetrics and Gynecology

## 2020-08-22 ENCOUNTER — Ambulatory Visit: Payer: Medicaid Other

## 2020-08-22 ENCOUNTER — Ambulatory Visit: Payer: Medicaid Other | Attending: Family Medicine

## 2020-08-22 ENCOUNTER — Other Ambulatory Visit: Payer: Self-pay

## 2020-08-22 DIAGNOSIS — Z3482 Encounter for supervision of other normal pregnancy, second trimester: Secondary | ICD-10-CM

## 2020-08-22 DIAGNOSIS — O99212 Obesity complicating pregnancy, second trimester: Secondary | ICD-10-CM | POA: Diagnosis not present

## 2020-08-22 DIAGNOSIS — Z3A19 19 weeks gestation of pregnancy: Secondary | ICD-10-CM | POA: Diagnosis not present

## 2020-08-22 DIAGNOSIS — Z1379 Encounter for other screening for genetic and chromosomal anomalies: Secondary | ICD-10-CM | POA: Insufficient documentation

## 2020-08-22 DIAGNOSIS — Z362 Encounter for other antenatal screening follow-up: Secondary | ICD-10-CM

## 2020-08-22 DIAGNOSIS — Z369 Encounter for antenatal screening, unspecified: Secondary | ICD-10-CM | POA: Diagnosis not present

## 2020-08-22 DIAGNOSIS — E669 Obesity, unspecified: Secondary | ICD-10-CM | POA: Diagnosis not present

## 2020-08-22 DIAGNOSIS — O321XX0 Maternal care for breech presentation, not applicable or unspecified: Secondary | ICD-10-CM | POA: Diagnosis not present

## 2020-08-22 DIAGNOSIS — Z6841 Body Mass Index (BMI) 40.0 and over, adult: Secondary | ICD-10-CM | POA: Diagnosis not present

## 2020-08-28 LAB — MATERNIT21 PLUS CORE+SCA
Fetal Fraction: 8
Monosomy X (Turner Syndrome): NOT DETECTED
Result (T21): NEGATIVE
Trisomy 13 (Patau syndrome): NEGATIVE
Trisomy 18 (Edwards syndrome): NEGATIVE
Trisomy 21 (Down syndrome): NEGATIVE
XXX (Triple X Syndrome): NOT DETECTED
XXY (Klinefelter Syndrome): NOT DETECTED
XYY (Jacobs Syndrome): NOT DETECTED

## 2020-08-29 ENCOUNTER — Telehealth: Payer: Self-pay | Admitting: Genetic Counselor

## 2020-08-29 NOTE — Telephone Encounter (Signed)
I called Ms. Pascoe to discuss her negative noninvasive prenatal screening (NIPS) result. Specifically, Ms. Deckard had MaterniT21 testing through [employer]. These negative results demonstrated an expected representation of chromosome 21, 18, 13, and sex chromosome material, greatly reducing the likelihood of trisomies 51, 1, or 43 and sex chromosome aneuploidies for the pregnancy. Th eexpected fetal sex was confirmed to be female.  NIPS analyzes placental (fetal) DNA in maternal circulation. NIPS is considered to be highly specific and sensitive, but is not considered to be a diagnostic test. We reviewed that this testing identifies 91-99% of pregnancies with trisomies 55, 33, and 59, as well as sex chromosome aneuploidies, but does not test for all genetic conditions. Diagnostic testing via amniocentesis is available should Ms. Cousin be interested in confirming this result. She confirmed that she had no questions about these results at this time.  Gershon Crane, MS, [facility] Genetic Counselor

## 2020-08-31 ENCOUNTER — Other Ambulatory Visit: Payer: Self-pay

## 2020-08-31 ENCOUNTER — Ambulatory Visit (INDEPENDENT_AMBULATORY_CARE_PROVIDER_SITE_OTHER): Payer: Medicaid Other

## 2020-08-31 ENCOUNTER — Other Ambulatory Visit (INDEPENDENT_AMBULATORY_CARE_PROVIDER_SITE_OTHER): Payer: Medicaid Other

## 2020-08-31 ENCOUNTER — Ambulatory Visit (INDEPENDENT_AMBULATORY_CARE_PROVIDER_SITE_OTHER): Payer: Medicaid Other | Admitting: Family Medicine

## 2020-08-31 ENCOUNTER — Ambulatory Visit (HOSPITAL_COMMUNITY)
Admission: RE | Admit: 2020-08-31 | Discharge: 2020-08-31 | Disposition: A | Discharge status: Home / Self Care | Payer: Medicaid Other | Source: Ambulatory Visit | Attending: Family Medicine | Admitting: Family Medicine

## 2020-08-31 VITALS — BP 117/61 | HR 76 | Wt 231.8 lb

## 2020-08-31 DIAGNOSIS — R002 Palpitations: Secondary | ICD-10-CM

## 2020-08-31 DIAGNOSIS — Z3482 Encounter for supervision of other normal pregnancy, second trimester: Secondary | ICD-10-CM | POA: Diagnosis present

## 2020-08-31 DIAGNOSIS — Z7901 Long term (current) use of anticoagulants: Secondary | ICD-10-CM

## 2020-08-31 LAB — POCT 1 HR PRENATAL GLUCOSE: Glucose 1 Hr Prenatal, POC: 110 mg/dL

## 2020-08-31 NOTE — Patient Instructions (Addendum)
Thank you for coming to see me today. It was a pleasure. Today we discussed your glucola test which was negative.  We also discussed the wart of breath.  We will provide you with the details of the water bath classes which she will have to take if you do decide to go forward with you will need to transfer your care to a midwife at [facility].  For the dizziness and palpitations-this could be normal symptoms during pregnancy.  Your blood pressure and EKG look normal in the clinic.  We will refer you for a EKG Holter which will check for abnormal heart rhythms.  Please follow-up with me in 4 weeks   If you have any questions or concerns, please do not hesitate to call the office at [phone].  Please go to the MAU if you develop abdominal pain/cramping pain, fluid leaking from the vagina such as bleeding, or reduced movement of baby etc. Best wishes,   Dr Allena Katz      Considering Linden Dolin? Guide for patients at Center for [facility]) Why consider waterbirth? . Gentle birth for babies  . Less pain medicine used in labor  . May allow for passive descent/less pushing  . May reduce perineal tears  . More mobility and instinctive maternal position changes  . Increased maternal relaxation   Is waterbirth safe? What are the risks of infection, drowning or other complications? . Infection:  Marland Kitchen Very low risk (3.7 % for tub vs 4.8% for bed)  . 7 in 8000 waterbirths with documented infection  . Poorly cleaned equipment most common cause  . Slightly lower group B strep transmission rate  . Drowning  . Maternal:  . Very low risk  . Related to seizures or fainting  . Newborn:  Marland Kitchen Very low risk. No evidence of increased risk of respiratory problems in multiple large studies  . Physiological protection from breathing under water  . Avoid underwater birth if there are any fetal complications  . Once baby's head is out of the water, keep it out.  . Birth complication   . Some reports of cord trauma, but risk decreased by bringing baby to surface gradually  . No evidence of increased risk of shoulder dystocia. Mothers can usually change positions faster in water than in a bed, possibly aiding the maneuvers to free the shoulder.   There are 2 things you MUST do to have a waterbirth with [facility]: 1. Attend a waterbirth class at [facility] at [facility]   a. 3rd Wednesday of every month from 7-9 pm (virtual during COVID) b. Free c. Register by calling [phone] or register online at [email or web address] d. Bring [place] the certificate from the class to your prenatal appointment or send via MyChart 2. Meet with a midwife at 36 weeks* to see if you can still plan a waterbirth and to sign the consent.   *We also recommend that you schedule as many of your prenatal visits with a midwife as possible.    Helpful information: . You may want to bring a bathing suit top to the hospital to wear during labor but this is optional.  All other supplies are provided by the hospital. . Please arrive at the hospital with signs of active labor, and do not wait at home until late in labor. It takes 45 min- 2 hours for COVID testing, fetal monitoring, and check in to your room to take place, plus transport and filling of the waterbirth tub.  Things that would prevent you from having a waterbirth: . Unknown or Positive COVID-19 diagnosis upon admission to hospital* . Premature, <37wks  . Previous cesarean birth  . Presence of thick meconium-stained fluid  . Multiple gestation (Twins, triplets, etc.)  . Uncontrolled diabetes or gestational diabetes requiring medication  . Hypertension diagnosed in pregnancy or preexisting hypertension (gestational hypertension, preeclampsia, or chronic hypertension) . Heavy vaginal bleeding  . Non-reassuring fetal heart rate  . Active infection (MRSA, etc.). Group B Strep is NOT a contraindication for waterbirth.  . If your  labor has to be induced and induction method requires continuous monitoring of the baby's heart rate  . Other risks/issues identified by your obstetrical provider   Please remember that birth is unpredictable. Under certain unforeseeable circumstances your provider may advise against giving birth in the tub. These decisions will be made on a case-by-case basis and with the safety of you and your baby as our highest priority.   *Please remember that in order to have a waterbirth, you must test Negative to COVID-19 upon admission to the hospital.

## 2020-08-31 NOTE — Progress Notes (Addendum)
  Patient Name: [name] Date of Birth: [birth year] [facility] Prenatal Visit  Alice Gibson is a 20 y.o. [phone] at [redacted]w[redacted]d here for routine follow up. She is dated by midtrimester ultrasound.  She reports dizziness,palpitations twice a week. which usually occurs after standing up. Usually lasts 30 mins. No associated chest pain or dyspnea.  She denies vaginal bleeding, fluid leakage or abdominal pain.  See flow sheet for details.  Vitals:   08/31/20 0940  BP: 117/61  Pulse: 76   Fetal HR 158  Fundal height: 20cm  A/P: Pregnancy at [redacted]w[redacted]d.  Doing well.    1. Routine Prenatal Care:  Marland Kitchen Dating reviewed, dating tab is correct . Fetal heart tones Appropriate  . Influenza vaccine previously administered.   Marland Kitchen COVID vaccination was discussed.  . The patient has the following indication for screening preexisting diabetes: BMI > 25 and sedentary lifestyle. . Anatomy ultrasound ordered to be scheduled at 18-20 weeks. . Genetic screening. Pt has had Maternity 21 test which was negative  . Pregnancy education including expected weight gain in pregnancy, OTC medication use, continued use of prenatal vitamin, smoking cessation if applicable, and nutrition in pregnancy.   . Bleeding and pain precautions reviewed.  2. Pregnancy issues include the following and were addressed as appropriate today:  . Cat litter-does not clean cat litter anymore, her husband cleans it. . Nauseous in the morning post wisdom tooth extraction- normal dentition on exam today, no evidence of pus/erythema/drainage etc. Recommend follow up with dentist . THC-no longer uses. Congratulated pt on efforts and encouraged her to abstain from [facility] use. . Asthma-uses albuterol once a month  . Hx of seizure disorder- last seizure was age 86. Has not been on seizure medications since then. Will reach out to Dr Shawnie Pons regarding if she high risk Ob because of this . Dizziness and palpitations-could be due to normal  pregnancy changes. Orthostatic Bps normal. EKG SR. Discussed referring for EKG Holter. Pt would like this for further monitoring. Will continue to monitor these sx. Safety precautions given. . Glucola obtained today. CBG 110 normal . Water-birth: provided resources for water birth classes. Discussed that if she chooses water birth she will need to transfer care to midwife at [facility]. She will discuss with her husband. . Depression-no longer depressed. Denies active or passive SI, feels well supported and safe. Marland Kitchen COVID booster-eligible for booster in April.  . Problem list  and pregnancy box updated: Yes.   Follow up 4 weeks.

## 2020-09-19 ENCOUNTER — Ambulatory Visit: Payer: Medicaid Other

## 2020-09-20 ENCOUNTER — Encounter: Payer: Self-pay | Admitting: *Deleted

## 2020-09-20 ENCOUNTER — Ambulatory Visit (HOSPITAL_BASED_OUTPATIENT_CLINIC_OR_DEPARTMENT_OTHER): Payer: Medicaid Other

## 2020-09-20 ENCOUNTER — Ambulatory Visit: Payer: Medicaid Other | Attending: Obstetrics and Gynecology | Admitting: *Deleted

## 2020-09-20 ENCOUNTER — Other Ambulatory Visit: Payer: Self-pay

## 2020-09-20 VITALS — BP 104/49 | HR 62

## 2020-09-20 DIAGNOSIS — Z362 Encounter for other antenatal screening follow-up: Secondary | ICD-10-CM

## 2020-09-20 DIAGNOSIS — Z6841 Body Mass Index (BMI) 40.0 and over, adult: Secondary | ICD-10-CM

## 2020-09-20 DIAGNOSIS — Z3A23 23 weeks gestation of pregnancy: Secondary | ICD-10-CM

## 2020-09-20 DIAGNOSIS — O99212 Obesity complicating pregnancy, second trimester: Secondary | ICD-10-CM | POA: Insufficient documentation

## 2020-09-20 DIAGNOSIS — E669 Obesity, unspecified: Secondary | ICD-10-CM

## 2020-09-21 ENCOUNTER — Other Ambulatory Visit: Payer: Self-pay | Admitting: *Deleted

## 2020-09-21 DIAGNOSIS — R638 Other symptoms and signs concerning food and fluid intake: Secondary | ICD-10-CM

## 2020-10-02 ENCOUNTER — Encounter: Payer: Medicaid Other | Admitting: Family Medicine

## 2020-10-02 NOTE — Progress Notes (Deleted)
  [facility] Prenatal Visit  Alice Gibson is a 20 y.o. G3P0011 at [redacted]w[redacted]d here for routine follow up. She is dated by {Ob dating:14516}.  She reports {symptoms; pregnancy related:14538}.  She reports good fetal movement. No bleeding, loss of fluid, contractions. See flow sheet for details. There were no vitals filed for this visit.   A/P: Pregnancy at [redacted]w[redacted]d.  Doing well.   . Dating reviewed, dating tab is {correct:23336::"correct"} . Fetal heart tones {appropriate:23337} . Fundal height {fundal height:23342::"within expected range. "} . Anatomy ultrasound reviewed and notable for ***.  . Influenza vaccine {given:23340}.  Marland Kitchen COVID vaccination was discussed and ***.  . Indications for screening for preexisting diabetes include: {Pre-existing diabetes screening:23343::"Reviewed indications for early 1 hour glucose testing, not indicated "}.  . Pregnancy education provided on the following topics: fetal growth and movement, ultrasound assessment, and upcoming laboratory assessment.   . Scheduled for [facility] during second trimester on ***. Marland Kitchen Preterm labor precautions given.   2. Pregnancy issues include the following and were addressed as appropriate today:  . *** . Problem list and pregnancy box updated: {yes/no:20286::"Yes"}.     Follow up 4 weeks.

## 2020-10-11 ENCOUNTER — Encounter: Payer: Medicaid Other | Admitting: Family Medicine

## 2020-10-11 NOTE — Progress Notes (Deleted)
  [facility] Prenatal Visit  Alice Gibson is a 20 y.o. G3P0011 at [redacted]w[redacted]d here for routine follow up. She is dated by midtrimester ultrasound.  She reports {symptoms; pregnancy related:14538}. She reports fetal movement. Denies vaginal bleeding, loss of fluid, or contractions.  See flow sheet for details.  A/P: Pregnancy at [redacted]w[redacted]d.  Doing well.   . Dating reviewed, dating tab is {correct:23336::"correct"} . Fetal heart tones {appropriate:23337} . Fundal height {fundal height:23342::"within expected range. "} . Influenza vaccine previously administered.   Marland Kitchen COVID vaccination was discussed and ***.  Marland Kitchen Screening for gestational diabetes {GDMcompleted:23344::"completed today and appropriate. "}. ***even if prior 1 hour test completed, if before 24 weeks, repeat. Screening before 24 weeks is screening for preexisting DM***  . Pregnancy education completed including: fetal growth, breastfeeding, contraception, and expected weight gain in pregnancy.   . The patient {CWHCS:23409::"does not have a history of Cesarean delivery and no referral to Center for [facility] is indicated"} . Scheduled for [facility] during second trimester on ***. Marland Kitchen Preterm labor, bleeding, and pain precautions given.    2. Pregnancy issues include the following and were addressed as appropriate today:  . *** . Problem list and pregnancy box updated: {yes/no:20286::"Yes"}.   Follow up 4 weeks.

## 2020-10-16 ENCOUNTER — Ambulatory Visit (INDEPENDENT_AMBULATORY_CARE_PROVIDER_SITE_OTHER): Payer: Medicaid Other | Admitting: Family Medicine

## 2020-10-16 ENCOUNTER — Other Ambulatory Visit: Payer: Self-pay

## 2020-10-16 VITALS — BP 120/60 | HR 88 | Wt 235.0 lb

## 2020-10-16 DIAGNOSIS — Z3483 Encounter for supervision of other normal pregnancy, third trimester: Secondary | ICD-10-CM | POA: Diagnosis present

## 2020-10-16 LAB — POCT 1 HR PRENATAL GLUCOSE: Glucose 1 Hr Prenatal, POC: 82 mg/dL

## 2020-10-16 NOTE — Progress Notes (Deleted)
   SUBJECTIVE:   CHIEF COMPLAINT / HPI:   Chief Complaint  Patient presents with  . Routine Prenatal Visit     Alice Gibson is a 20 y.o. female here for ***   Pt reports ***    PERTINENT  PMH / PSH: reviewed and updated as appropriate   OBJECTIVE:   BP 120/60   Pulse 88   Wt 235 lb (106.6 kg)   LMP 04/24/2020 (Within Weeks)   BMI 47.46 kg/m   ***  ASSESSMENT/PLAN:   No problem-specific Assessment & Plan notes found for this encounter.     Katha Cabal, DO PGY-2, [facility] 10/16/2020      {    This will disappear when note is signed, click to select method of visit    :1}

## 2020-10-16 NOTE — Patient Instructions (Addendum)
It was great seeing you today!   I'd like to see you back 2-3 weeks but if you need to be seen earlier than that for any new issues we're happy to fit you in, just give [place] a call!   If you have questions or concerns please do not hesitate to call at [phone].  Dr. [facility]     Third Trimester of Pregnancy  The third trimester of pregnancy is from week 28 through week 40. This is months 7 through 9. The third trimester is a time when the unborn baby (fetus) is growing rapidly. At the end of the ninth month, the fetus is about 20 inches long and weighs 6-10 pounds. Body changes during your third trimester During the third trimester, your body will continue to go through many changes. The changes vary and generally return to normal after your baby is born. Physical changes  Your weight will continue to increase. You can expect to gain 25-35 pounds (11-16 kg) by the end of the pregnancy if you begin pregnancy at a normal weight. If you are underweight, you can expect to gain 28-40 lb (about 13-18 kg), and if you are overweight, you can expect to gain 15-25 lb (about 7-11 kg).  You may begin to get stretch marks on your hips, abdomen, and breasts.  Your breasts will continue to grow and may hurt. A yellow fluid (colostrum) may leak from your breasts. This is the first milk you are producing for your baby.  You may have changes in your hair. These can include thickening of your hair, rapid growth, and changes in texture. Some people also have hair loss during or after pregnancy, or hair that feels dry or thin.  Your belly button may stick out.  You may notice more swelling in your hands, face, or ankles. Health changes  You may have heartburn.  You may have constipation.  You may develop hemorrhoids.  You may develop swollen, bulging veins (varicose veins) in your legs.  You may have increased body aches in the pelvis, back, or thighs. This is  due to weight gain and increased hormones that are relaxing your joints.  You may have increased tingling or numbness in your hands, arms, and legs. The skin on your abdomen may also feel numb.  You may feel short of breath because of your expanding uterus. Other changes  You may urinate more often because the fetus is moving lower into your pelvis and pressing on your bladder.  You may have more problems sleeping. This may be caused by the size of your abdomen, an increased need to urinate, and an increase in your body's metabolism.  You may notice the fetus "dropping," or moving lower in your abdomen (lightening).  You may have increased vaginal discharge.  You may notice that you have pain around your pelvic bone as your uterus distends. Follow these instructions at home: Medicines  Follow your health care provider's instructions regarding medicine use. Specific medicines may be either safe or unsafe to take during pregnancy. Do not take any medicines unless approved by your health care provider.  Take a prenatal vitamin that contains at least 600 micrograms (mcg) of folic acid. Eating and drinking  Eat a healthy diet that includes fresh fruits and vegetables, whole grains, good sources of protein such as meat, eggs, or tofu, and low-fat dairy products.  Avoid raw meat and unpasteurized juice, milk, and cheese. These carry germs that can harm  you and your baby.  Eat 4 or 5 small meals rather than 3 large meals a day.  You may need to take these actions to prevent or treat constipation: ? Drink enough fluid to keep your urine pale yellow. ? Eat foods that are high in fiber, such as beans, whole grains, and fresh fruits and vegetables. ? Limit foods that are high in fat and processed sugars, such as fried or sweet foods. Activity  Exercise only as directed by your health care provider. Most people can continue their usual exercise routine during pregnancy. Try to exercise for 30  minutes at least 5 days a week. Stop exercising if you experience contractions in the uterus.  Stop exercising if you develop pain or cramping in the lower abdomen or lower back.  Avoid heavy lifting.  Do not exercise if it is very hot or humid or if you are at a high altitude.  If you choose to, you may continue to have sex unless your health care provider tells you not to. Relieving pain and discomfort  Take frequent breaks and rest with your legs raised (elevated) if you have leg cramps or low back pain.  Take warm sitz baths to soothe any pain or discomfort caused by hemorrhoids. Use hemorrhoid cream if your health care provider approves.  Wear a supportive bra to prevent discomfort from breast tenderness.  If you develop varicose veins: ? Wear support hose as told by your health care provider. ? Elevate your feet for 15 minutes, 3-4 times a day. ? Limit salt in your diet. Safety  Talk to your health care provider before traveling far distances.  Do not use hot tubs, steam rooms, or saunas.  Wear your seat belt at all times when driving or riding in a car.  Talk with your health care provider if someone is verbally or physically abusive to you. Preparing for birth To prepare for the arrival of your baby:  Take prenatal classes to understand, practice, and ask questions about labor and delivery.  Visit the hospital and tour the maternity area.  Purchase a rear-facing car seat and make sure you know how to install it in your car.  Prepare the baby's room or sleeping area. Make sure to remove all pillows and stuffed animals from the baby's crib to prevent suffocation. General instructions  Avoid cat litter boxes and soil used by cats. These carry germs that can cause birth defects in the baby. If you have a cat, ask someone to clean the litter box for you.  Do not douche or use tampons. Do not use scented sanitary pads.  Do not use any products that contain nicotine or  tobacco, such as cigarettes, e-cigarettes, and chewing tobacco. If you need help quitting, ask your health care provider.  Do not use any herbal remedies, illegal drugs, or medicines that were not prescribed to you. Chemicals in these products can harm your baby.  Do not drink alcohol.  You will have more frequent prenatal exams during the third trimester. During a routine prenatal visit, your health care provider will do a physical exam, perform tests, and discuss your overall health. Keep all follow-up visits. This is important. Where to find more information  American Pregnancy Association: americanpregnancy.org  [employer] of [employer]stetricians and Gynecologists: https://www.todd-brady.net/  Office on [employer] Health: MightyReward.co.nz Contact a health care provider if you have:  A fever.  Mild pelvic cramps, pelvic pressure, or nagging pain in your abdominal area or lower back.  Vomiting or diarrhea.  Bad-smelling vaginal discharge or foul-smelling urine.  Pain when you urinate.  A headache that does not go away when you take medicine.  Visual changes or see spots in front of your eyes. Get help right away if:  Your water breaks.  You have regular contractions less than 5 minutes apart.  You have spotting or bleeding from your vagina.  You have severe abdominal pain.  You have difficulty breathing.  You have chest pain.  You have fainting spells.  You have not felt your baby move for the time period told by your health care provider.  You have new or increased pain, swelling, or redness in an arm or leg. Summary  The third trimester of pregnancy is from week 28 through week 40 (months 7 through 9).  You may have more problems sleeping. This can be caused by the size of your abdomen, an increased need to urinate, and an increase in your body's metabolism.  You will have more frequent prenatal exams during the third trimester. Keep all  follow-up visits. This is important. This information is not intended to replace advice given to you by your health care provider. Make sure you discuss any questions you have with your health care provider. Document Revised: 12/08/2019 Document Reviewed: 10/14/2019 Elsevier Patient Education  2021 [employer].

## 2020-10-16 NOTE — Progress Notes (Signed)
   [facility] Prenatal Visit  Alice Gibson is a 20 y.o. G3P0011 at [redacted]w[redacted]d here for routine follow up. She is dated by early ultrasound.  She reports backache. She reports fetal movement. Denies vaginal bleeding, loss of fluid, or contractions.  See flow sheet for details.  A/P: Pregnancy at [redacted]w[redacted]d.  Doing well.   . Dating reviewed, dating tab is correct . Fetal heart tones Appropriate . Fundal height within expected range.  . Influenza vaccine previously administered.   Marland Kitchen COVID vaccination was discussed and UTD. Pt due for booster after 10/23/20.  . Screening for gestational diabetes completed today and appropriate.  . Pregnancy education completed including: fetal growth, breastfeeding, contraception, and expected weight gain in pregnancy.   . The patient does not have a history of Cesarean delivery and no referral to Center for [facility] is indicated . [facility] during second trimester was on 08/31/20. . Preterm labor, bleeding, and pain precautions given.    2. Pregnancy issues include the following and were addressed as appropriate today:   Cat litter-does not clean cat litter anymore, her husband cleans it.  THC-no longer uses. Congratulated pt on efforts and encouraged her to abstain from [facility] use.  Asthma-uses albuterol maybe once a month   Hx of seizure disorder- last seizure was age 84. Has not been on seizure medications since then. Will need to check with Dr. Shawnie Pons to be sure pt does not need HROB appointment.   Dizziness and palpitations-resolved. Pt has not yet completed ZioPatch. Encouraged her to complete. Confirmed with referral coordinator that if results are concerning cardiology referral will be needed at that time.   Previous early 1hr GTT was CBG normal. Repeat 1hr Glucola obtained today was normal at 82.  Water-birth: provided resources for water birth classes given previously. Pt does not want to pursue water-birthing classes.    Depression-no longer depressed. Denies active or passive SI, feels well supported and safe in her relationship.  COVID booster-eligible for booster eligible after 10/23/20. Pt would like this at next visit.   Pt will need third trimester Faculty OB visit scheduled at next visit.   Pt is Rh positive will not repeat antibody screen today. CBC, HIV and RPR today.  Problem list and pregnancy box updated: Yes.   Follow up 2-3 weeks.  Katha Cabal, DO PGY-2, [facility] 10/16/2020

## 2020-10-17 DIAGNOSIS — R002 Palpitations: Secondary | ICD-10-CM | POA: Diagnosis not present

## 2020-10-17 LAB — CBC
Hematocrit: 36.8 % (ref 34.0–46.6)
Hemoglobin: 12.3 g/dL (ref 11.1–15.9)
MCH: 29.9 pg (ref 26.6–33.0)
MCHC: 33.4 g/dL (ref 31.5–35.7)
MCV: 89 fL (ref 79–97)
Platelets: 279 10*3/uL (ref 150–450)
RBC: 4.12 x10E6/uL (ref 3.77–5.28)
RDW: 12.2 % (ref 11.7–15.4)
WBC: 11 10*3/uL — ABNORMAL HIGH (ref 3.4–10.8)

## 2020-10-17 LAB — HIV ANTIBODY (ROUTINE TESTING W REFLEX): HIV Screen 4th Generation wRfx: NONREACTIVE

## 2020-10-17 LAB — RPR: RPR Ser Ql: NONREACTIVE

## 2020-10-18 ENCOUNTER — Other Ambulatory Visit: Payer: Self-pay | Admitting: Obstetrics

## 2020-10-18 ENCOUNTER — Encounter: Payer: Self-pay | Admitting: *Deleted

## 2020-10-18 ENCOUNTER — Ambulatory Visit: Payer: Medicaid Other | Attending: Obstetrics

## 2020-10-18 ENCOUNTER — Ambulatory Visit: Payer: Medicaid Other | Admitting: *Deleted

## 2020-10-18 ENCOUNTER — Other Ambulatory Visit: Payer: Self-pay

## 2020-10-18 DIAGNOSIS — O365990 Maternal care for other known or suspected poor fetal growth, unspecified trimester, not applicable or unspecified: Secondary | ICD-10-CM | POA: Diagnosis present

## 2020-10-18 DIAGNOSIS — O365920 Maternal care for other known or suspected poor fetal growth, second trimester, not applicable or unspecified: Secondary | ICD-10-CM | POA: Diagnosis not present

## 2020-10-18 DIAGNOSIS — R638 Other symptoms and signs concerning food and fluid intake: Secondary | ICD-10-CM | POA: Insufficient documentation

## 2020-10-18 DIAGNOSIS — Z362 Encounter for other antenatal screening follow-up: Secondary | ICD-10-CM | POA: Diagnosis not present

## 2020-10-18 DIAGNOSIS — O99212 Obesity complicating pregnancy, second trimester: Secondary | ICD-10-CM | POA: Diagnosis not present

## 2020-10-18 DIAGNOSIS — Z3A27 27 weeks gestation of pregnancy: Secondary | ICD-10-CM

## 2020-10-18 NOTE — Procedures (Signed)
Alice Gibson 2001/06/14 [redacted]w[redacted]d  Fetus A Non-Stress Test Interpretation for 10/18/20  Indication: fetal growth restriction  Fetal Heart Rate A Mode: External Baseline Rate (A): 150 bpm Variability: Minimal,Moderate Accelerations: 15 x 15 Decelerations: None Multiple birth?: No  Uterine Activity Mode: Palpation,Toco Contraction Frequency (min): none Resting Tone Palpated: Relaxed Resting Time: Adequate  Interpretation (Fetal Testing) Nonstress Test Interpretation: Reactive Overall Impression: Reassuring for gestational age Comments: Dr. Grace Bushy reviewed tracing.

## 2020-10-19 ENCOUNTER — Other Ambulatory Visit: Payer: Self-pay | Admitting: *Deleted

## 2020-10-19 DIAGNOSIS — O365920 Maternal care for other known or suspected poor fetal growth, second trimester, not applicable or unspecified: Secondary | ICD-10-CM

## 2020-11-01 ENCOUNTER — Ambulatory Visit (HOSPITAL_BASED_OUTPATIENT_CLINIC_OR_DEPARTMENT_OTHER): Payer: Medicaid Other | Admitting: *Deleted

## 2020-11-01 ENCOUNTER — Other Ambulatory Visit: Payer: Self-pay

## 2020-11-01 ENCOUNTER — Encounter: Payer: Self-pay | Admitting: *Deleted

## 2020-11-01 ENCOUNTER — Ambulatory Visit: Payer: Medicaid Other | Admitting: *Deleted

## 2020-11-01 ENCOUNTER — Ambulatory Visit: Payer: Medicaid Other | Attending: Maternal & Fetal Medicine

## 2020-11-01 VITALS — BP 106/60 | HR 93

## 2020-11-01 DIAGNOSIS — Z3A29 29 weeks gestation of pregnancy: Secondary | ICD-10-CM

## 2020-11-01 DIAGNOSIS — O365931 Maternal care for other known or suspected poor fetal growth, third trimester, fetus 1: Secondary | ICD-10-CM | POA: Insufficient documentation

## 2020-11-01 DIAGNOSIS — E669 Obesity, unspecified: Secondary | ICD-10-CM

## 2020-11-01 DIAGNOSIS — O365930 Maternal care for other known or suspected poor fetal growth, third trimester, not applicable or unspecified: Secondary | ICD-10-CM

## 2020-11-01 DIAGNOSIS — Z362 Encounter for other antenatal screening follow-up: Secondary | ICD-10-CM | POA: Diagnosis not present

## 2020-11-01 DIAGNOSIS — O365920 Maternal care for other known or suspected poor fetal growth, second trimester, not applicable or unspecified: Secondary | ICD-10-CM | POA: Insufficient documentation

## 2020-11-01 DIAGNOSIS — O99213 Obesity complicating pregnancy, third trimester: Secondary | ICD-10-CM | POA: Diagnosis not present

## 2020-11-01 NOTE — Procedures (Signed)
ANALEIGH ARIES January 15, 2001 [redacted]w[redacted]d  Fetus A Non-Stress Test Interpretation for 11/01/20  Indication: IUGR  Fetal Heart Rate A Mode: External Baseline Rate (A): 150 bpm Variability: Moderate Accelerations: 10 x 10 Decelerations: None Multiple birth?: No  Uterine Activity Mode: Palpation,Toco Contraction Frequency (min): none Resting Tone Palpated: Relaxed Resting Time: Adequate  Interpretation (Fetal Testing) Nonstress Test Interpretation: Reactive Overall Impression: Reassuring for gestational age Comments: Dr. Parke Poisson reviewed tracing.

## 2020-11-02 ENCOUNTER — Other Ambulatory Visit: Payer: Self-pay | Admitting: *Deleted

## 2020-11-06 ENCOUNTER — Ambulatory Visit: Payer: Medicaid Other | Admitting: Family Medicine

## 2020-11-08 ENCOUNTER — Encounter: Payer: Self-pay | Admitting: *Deleted

## 2020-11-08 ENCOUNTER — Other Ambulatory Visit: Payer: Self-pay

## 2020-11-08 ENCOUNTER — Ambulatory Visit: Payer: Medicaid Other | Admitting: *Deleted

## 2020-11-08 ENCOUNTER — Ambulatory Visit: Payer: Medicaid Other | Attending: Obstetrics and Gynecology | Admitting: *Deleted

## 2020-11-08 VITALS — BP 91/58 | HR 81

## 2020-11-08 DIAGNOSIS — O365930 Maternal care for other known or suspected poor fetal growth, third trimester, not applicable or unspecified: Secondary | ICD-10-CM

## 2020-11-08 DIAGNOSIS — O365931 Maternal care for other known or suspected poor fetal growth, third trimester, fetus 1: Secondary | ICD-10-CM | POA: Diagnosis not present

## 2020-11-08 DIAGNOSIS — Z3A30 30 weeks gestation of pregnancy: Secondary | ICD-10-CM | POA: Diagnosis not present

## 2020-11-08 NOTE — Progress Notes (Signed)
NST reviewed and reactive; continue scheduled antenatal testing. 

## 2020-11-08 NOTE — Procedures (Signed)
JERIE BASFORD 04/12/2001 [redacted]w[redacted]d  Fetus A Non-Stress Test Interpretation for 11/08/20  Indication: IUGR  Fetal Heart Rate A Mode: External Baseline Rate (A): 145 bpm Variability: Moderate Accelerations: 15 x 15 Decelerations: None Multiple birth?: No  Uterine Activity Mode: Palpation,Toco Contraction Frequency (min): none Resting Tone Palpated: Relaxed Resting Time: Adequate  Interpretation (Fetal Testing) Nonstress Test Interpretation: Reactive Overall Impression: Reassuring for gestational age Comments: dr. Judeth Cornfield reviewed tracing.

## 2020-11-15 ENCOUNTER — Ambulatory Visit: Payer: Medicaid Other | Admitting: *Deleted

## 2020-11-15 ENCOUNTER — Other Ambulatory Visit: Payer: Self-pay

## 2020-11-15 ENCOUNTER — Ambulatory Visit (INDEPENDENT_AMBULATORY_CARE_PROVIDER_SITE_OTHER): Payer: Medicaid Other | Admitting: Family Medicine

## 2020-11-15 ENCOUNTER — Ambulatory Visit (INDEPENDENT_AMBULATORY_CARE_PROVIDER_SITE_OTHER): Payer: Medicaid Other

## 2020-11-15 ENCOUNTER — Ambulatory Visit (HOSPITAL_BASED_OUTPATIENT_CLINIC_OR_DEPARTMENT_OTHER): Payer: Medicaid Other | Admitting: *Deleted

## 2020-11-15 ENCOUNTER — Encounter: Payer: Self-pay | Admitting: *Deleted

## 2020-11-15 ENCOUNTER — Encounter: Payer: Self-pay | Admitting: Family Medicine

## 2020-11-15 ENCOUNTER — Ambulatory Visit: Payer: Medicaid Other | Attending: Maternal & Fetal Medicine

## 2020-11-15 VITALS — BP 119/56 | HR 78

## 2020-11-15 VITALS — Ht 59.0 in | Wt 241.4 lb

## 2020-11-15 DIAGNOSIS — O365930 Maternal care for other known or suspected poor fetal growth, third trimester, not applicable or unspecified: Secondary | ICD-10-CM | POA: Diagnosis present

## 2020-11-15 DIAGNOSIS — Z3A31 31 weeks gestation of pregnancy: Secondary | ICD-10-CM

## 2020-11-15 DIAGNOSIS — O99213 Obesity complicating pregnancy, third trimester: Secondary | ICD-10-CM | POA: Diagnosis not present

## 2020-11-15 DIAGNOSIS — Z3482 Encounter for supervision of other normal pregnancy, second trimester: Secondary | ICD-10-CM | POA: Diagnosis not present

## 2020-11-15 DIAGNOSIS — Z23 Encounter for immunization: Secondary | ICD-10-CM

## 2020-11-15 DIAGNOSIS — O365931 Maternal care for other known or suspected poor fetal growth, third trimester, fetus 1: Secondary | ICD-10-CM | POA: Diagnosis present

## 2020-11-15 DIAGNOSIS — O365920 Maternal care for other known or suspected poor fetal growth, second trimester, not applicable or unspecified: Secondary | ICD-10-CM | POA: Diagnosis present

## 2020-11-15 NOTE — Patient Instructions (Addendum)
Everything looks good today. I will check on your ultrasound that is done today, if your scan indicates that you will need to deliver at 37weeks, then we will change your appointment to come in sooner for some of your tests. Otherwise, your next appointment is scheduled for June 9th with the faculty clinic. Your Tdap shot was also given today.         Third Trimester of Pregnancy  The third trimester of pregnancy is from week 28 through week 40. This is also called months 7 through 9. This trimester is when your unborn baby (fetus) is growing very fast. At the end of the ninth month, the unborn baby is about 20 inches long. It weighs about 6-10 pounds. Body changes during your third trimester Your body continues to go through many changes during this time. The changes vary and generally return to normal after the baby is born. Physical changes  Your weight will continue to increase. You may gain 25-35 pounds (11-16 kg) by the end of the pregnancy. If you are underweight, you may gain 28-40 lb (about 13-18 kg). If you are overweight, you may gain 15-25 lb (about 7-11 kg).  You may start to get stretch marks on your hips, belly (abdomen), and breasts.  Your breasts will continue to grow and may hurt. A yellow fluid (colostrum) may leak from your breasts. This is the first milk you are making for your baby.  You may have changes in your hair.  Your belly button may stick out.  You may have more swelling in your hands, face, or ankles. Health changes  You may have heartburn.  You may have trouble pooping (constipation).  You may get hemorrhoids. These are swollen veins in the butt that can itch or get painful.  You may have swollen veins (varicose veins) in your legs.  You may have more body aches in the pelvis, back, or thighs.  You may have more tingling or numbness in your hands, arms, and legs. The skin on your belly may also feel numb.  You may feel short of breath as your  womb (uterus) gets bigger. Other changes  You may pee (urinate) more often.  You may have more problems sleeping.  You may notice the unborn baby "dropping," or moving lower in your belly.  You may have more discharge coming from your vagina.  Your joints may feel loose, and you may have pain around your pelvic bone. Follow these instructions at home: Medicines  Take over-the-counter and prescription medicines only as told by your doctor. Some medicines are not safe during pregnancy.  Take a prenatal vitamin that contains at least 600 micrograms (mcg) of folic acid. Eating and drinking  Eat healthy meals that include: ? Fresh fruits and vegetables. ? Whole grains. ? Good sources of protein, such as meat, eggs, or tofu. ? Low-fat dairy products.  Avoid raw meat and unpasteurized juice, milk, and cheese. These carry germs that can harm you and your baby.  Eat 4 or 5 small meals rather than 3 large meals a day.  You may need to take these actions to prevent or treat trouble pooping: ? Drink enough fluids to keep your pee (urine) pale yellow. ? Eat foods that are high in fiber. These include beans, whole grains, and fresh fruits and vegetables. ? Limit foods that are high in fat and sugar. These include fried or sweet foods. Activity  Exercise only as told by your doctor. Stop exercising if you  start to have cramps in your womb.  Avoid heavy lifting.  Do not exercise if it is too hot or too humid, or if you are in a place of great height (high altitude).  If you choose to, you may have sex unless your doctor tells you not to. Relieving pain and discomfort  Take breaks often, and rest with your legs raised (elevated) if you have leg cramps or low back pain.  Take warm water baths (sitz baths) to soothe pain or discomfort caused by hemorrhoids. Use hemorrhoid cream if your doctor approves.  Wear a good support bra if your breasts are tender.  If you develop bulging,  swollen veins in your legs: ? Wear support hose as told by your doctor. ? Raise your feet for 15 minutes, 3-4 times a day. ? Limit salt in your food. Safety  Talk to your doctor before traveling far distances.  Do not use hot tubs, steam rooms, or saunas.  Wear your seat belt at all times when you are in a car.  Talk with your doctor if someone is hurting you or yelling at you a lot. Preparing for your baby's arrival To prepare for the arrival of your baby:  Take prenatal classes.  Visit the hospital and tour the maternity area.  Buy a rear-facing car seat. Learn how to install it in your car.  Prepare the baby's room. Take out all pillows and stuffed animals from the baby's crib. General instructions  Avoid cat litter boxes and soil used by cats. These carry germs that can cause harm to the baby and can cause a loss of your baby by miscarriage or stillbirth.  Do not douche or use tampons. Do not use scented sanitary pads.  Do not smoke or use any products that contain nicotine or tobacco. If you need help quitting, ask your doctor.  Do not drink alcohol.  Do not use herbal medicines, illegal drugs, or medicines that were not approved by your doctor. Chemicals in these products can affect your baby.  Keep all follow-up visits. This is important. Where to find more information  American Pregnancy Association: americanpregnancy.org  [employer] Obst[employer]ians and Gynecologists: www.acog.org  Office on Women's Health: [place].[place]health.gov/pregnancy Contact a doctor if:  You have a fever.  You have mild cramps or pressure in your lower belly.  You have a nagging pain in your belly area.  You vomit, or you have watery poop (diarrhea).  You have bad-smelling fluid coming from your vagina.  You have pain when you pee, or your pee smells bad.  You have a headache that does not go away when you take medicine.  You have changes in how you see, or you see spots in  front of your eyes. Get help right away if:  Your water breaks.  You have regular contractions that are less than 5 minutes apart.  You are spotting or bleeding from your vagina.  You have very bad belly cramps or pain.  You have trouble breathing.  You have chest pain.  You faint.  You have not felt the baby move for the amount of time told by your doctor.  You have new or increased pain, swelling, or redness in an arm or leg. Summary  The third trimester is from week 28 through week 40 (months 7 through 9). This is the time when your unborn baby is growing very fast.  During this time, your discomfort may increase as you gain weight and as your  baby grows.  Get ready for your baby to arrive by taking prenatal classes, buying a rear-facing car seat, and preparing the baby's room.  Get help right away if you are bleeding from your vagina, you have chest pain and trouble breathing, or you have not felt the baby move for the amount of time told by your doctor. This information is not intended to replace advice given to you by your health care provider. Make sure you discuss any questions you have with your health care provider. Document Revised: 12/08/2019 Document Reviewed: 10/14/2019 Elsevier Patient Education  2021 Elsevier Inc.     Round Ligament Pain  The round ligament is a cord of muscle and tissue that helps support the uterus. It can become a source of pain during pregnancy if it becomes stretched or twisted as the baby grows. The pain usually begins in the second trimester (13-28 weeks) of pregnancy, and it can come and go until the baby is delivered. It is not a serious problem, and it does not cause harm to the baby. Round ligament pain is usually a short, sharp, and pinching pain, but it can also be a dull, lingering, and aching pain. The pain is felt in the lower side of the abdomen or in the groin. It usually starts deep in the groin and moves up to the outside of  the hip area. The pain may occur when you:  Suddenly change position, such as quickly going from a sitting to standing position.  Roll over in bed.  Cough or sneeze.  Do physical activity. Follow these instructions at home:  Watch your condition for any changes.  When the pain starts, relax. Then try any of these methods to help with the pain: ? Sitting down. ? Flexing your knees up to your abdomen. ? Lying on your side with one pillow under your abdomen and another pillow between your legs. ? Sitting in a warm bath for 15-20 minutes or until the pain goes away.  Take over-the-counter and prescription medicines only as told by your health care provider.  Move slowly when you sit down or stand up.  Avoid long walks if they cause pain.  Stop or reduce your physical activities if they cause pain.  Keep all follow-up visits as told by your health care provider. This is important.   Contact a health care provider if:  Your pain does not go away with treatment.  You feel pain in your back that you did not have before.  Your medicine is not helping. Get help right away if:  You have a fever or chills.  You develop uterine contractions.  You have vaginal bleeding.  You have nausea or vomiting.  You have diarrhea.  You have pain when you urinate. Summary  Round ligament pain is felt in the lower abdomen or groin. It is usually a short, sharp, and pinching pain. It can also be a dull, lingering, and aching pain.  This pain usually begins in the second trimester (13-28 weeks). It occurs because the uterus is stretching with the growing baby, and it is not harmful to the baby.  You may notice the pain when you suddenly change position, when you cough or sneeze, or during physical activity.  Relaxing, flexing your knees to your abdomen, lying on one side, or taking a warm bath may help to get rid of the pain.  Get help from your health care provider if the pain does not  go away or if  you have vaginal bleeding, nausea, vomiting, diarrhea, or painful urination. This information is not intended to replace advice given to you by your health care provider. Make sure you discuss any questions you have with your health care provider. Document Revised: 12/17/2017 Document Reviewed: 12/17/2017 Elsevier Patient Education  2021 [employer].

## 2020-11-15 NOTE — Progress Notes (Signed)
  [facility] Prenatal Visit  Alice Gibson is a 20 y.o. G3P0011 at [redacted]w[redacted]d here for routine follow up. She is dated by early ultrasound.  She reports no complaints.  She reports fetal movement. She denies vaginal bleeding, contractions, or loss of fluid.  See flow sheet for details. When walking or after a long day at work. she is having some cramping type pain "like I'm about to have my period"   There were no vitals filed for this visit.   A/P: Pregnancy at [redacted]w[redacted]d.  Doing well.   1. Routine prenatal care:  Marland Kitchen Dating reviewed, dating tab is correct . Fetal heart tones: Appropriate at 148 . Fundal height: >2 cm from expected size given dating, discussed with preceptor.  (measured at 28-29cm) . The patient does not have a history of HSV and valacyclovir is not indicated at this time.  . The patient does not have a history of Cesarean delivery and no referral to Center for [facility] is indicated . Infant feeding choice: Breastfeeding . Contraception choice: IUD . Infant circumcision desired no . Influenza vaccine previously administered.   . Tdap was given today. Marland Kitchen COVID vaccination was discussed and patient is already vaccinated.  . Pregnancy education regarding benefits of breastfeeding, contraception, fetal growth, expected weight gain, and safe infant sleep were discussed.  . Preterm labor and fetal movement precautions reviewed.   2. Pregnancy issues include the following and were addressed as appropriate today: . US shows EFW at 5th %ile. Patient is being referred to the high risk Ob clinic. Currently getting weekly US doppler . Hx of seizure disorder (last seizure at age 26), currently not on medications . Previously having issues with heart palpitations, ZioPatch was worn with no concerns from results. Marland Kitchen Hx of asthma, uses albuterol about once per month . Problem list and pregnancy box updated: Yes.     Follow up 2 weeks scheduled and 3rd trimester Ob  faculty visit scheduled. Referral to high risk Ob was placed today, appointments scheduled in case there is a delay in getting an appointment- patient aware of referral.

## 2020-11-15 NOTE — Procedures (Signed)
Alice Gibson [birth year] [redacted]w[redacted]d  Fetus A Non-Stress Test Interpretation for 11/15/20  Indication: IUGR  Fetal Heart Rate A Mode: External Baseline Rate (A): 155 bpm Variability: Moderate Accelerations: 15 x 15 Decelerations: None Multiple birth?: No  Uterine Activity Mode: Palpation,Toco Contraction Frequency (min): none Resting Tone Palpated: Relaxed Resting Time: Adequate  Interpretation (Fetal Testing) Nonstress Test Interpretation: Reactive Overall Impression: Reassuring for gestational age Comments: Dr. Judeth Cornfield reviewed tracing.

## 2020-11-16 ENCOUNTER — Other Ambulatory Visit: Payer: Self-pay | Admitting: *Deleted

## 2020-11-16 DIAGNOSIS — O365930 Maternal care for other known or suspected poor fetal growth, third trimester, not applicable or unspecified: Secondary | ICD-10-CM

## 2020-11-20 ENCOUNTER — Telehealth: Payer: Self-pay

## 2020-11-22 ENCOUNTER — Other Ambulatory Visit: Payer: Self-pay

## 2020-11-22 ENCOUNTER — Ambulatory Visit: Payer: Medicaid Other | Attending: Obstetrics and Gynecology

## 2020-11-22 ENCOUNTER — Ambulatory Visit: Payer: Medicaid Other | Admitting: *Deleted

## 2020-11-22 ENCOUNTER — Encounter: Payer: Self-pay | Admitting: *Deleted

## 2020-11-22 VITALS — BP 105/58 | HR 83

## 2020-11-22 DIAGNOSIS — O365930 Maternal care for other known or suspected poor fetal growth, third trimester, not applicable or unspecified: Secondary | ICD-10-CM

## 2020-11-22 DIAGNOSIS — E669 Obesity, unspecified: Secondary | ICD-10-CM

## 2020-11-22 DIAGNOSIS — Z3A32 32 weeks gestation of pregnancy: Secondary | ICD-10-CM

## 2020-11-22 DIAGNOSIS — O99213 Obesity complicating pregnancy, third trimester: Secondary | ICD-10-CM | POA: Diagnosis not present

## 2020-11-22 DIAGNOSIS — Z362 Encounter for other antenatal screening follow-up: Secondary | ICD-10-CM

## 2020-11-29 ENCOUNTER — Encounter: Payer: Self-pay | Admitting: *Deleted

## 2020-11-29 ENCOUNTER — Other Ambulatory Visit: Payer: Self-pay

## 2020-11-29 ENCOUNTER — Other Ambulatory Visit: Payer: Self-pay | Admitting: Obstetrics and Gynecology

## 2020-11-29 ENCOUNTER — Ambulatory Visit: Payer: Medicaid Other | Admitting: *Deleted

## 2020-11-29 ENCOUNTER — Ambulatory Visit: Payer: Medicaid Other | Attending: Obstetrics and Gynecology

## 2020-11-29 VITALS — BP 109/66 | HR 79

## 2020-11-29 DIAGNOSIS — O365930 Maternal care for other known or suspected poor fetal growth, third trimester, not applicable or unspecified: Secondary | ICD-10-CM

## 2020-11-29 DIAGNOSIS — O99213 Obesity complicating pregnancy, third trimester: Secondary | ICD-10-CM

## 2020-11-29 DIAGNOSIS — Z362 Encounter for other antenatal screening follow-up: Secondary | ICD-10-CM

## 2020-11-29 DIAGNOSIS — E669 Obesity, unspecified: Secondary | ICD-10-CM | POA: Diagnosis not present

## 2020-11-29 DIAGNOSIS — Z3A33 33 weeks gestation of pregnancy: Secondary | ICD-10-CM

## 2020-12-04 ENCOUNTER — Encounter: Payer: Medicaid Other | Admitting: Family Medicine

## 2020-12-04 ENCOUNTER — Other Ambulatory Visit: Payer: Self-pay

## 2020-12-04 ENCOUNTER — Encounter: Payer: Self-pay | Admitting: Obstetrics and Gynecology

## 2020-12-04 ENCOUNTER — Ambulatory Visit (INDEPENDENT_AMBULATORY_CARE_PROVIDER_SITE_OTHER): Payer: Medicaid Other | Admitting: Obstetrics and Gynecology

## 2020-12-04 DIAGNOSIS — O365990 Maternal care for other known or suspected poor fetal growth, unspecified trimester, not applicable or unspecified: Secondary | ICD-10-CM | POA: Insufficient documentation

## 2020-12-04 DIAGNOSIS — Z3402 Encounter for supervision of normal first pregnancy, second trimester: Secondary | ICD-10-CM

## 2020-12-04 NOTE — Progress Notes (Signed)
   PRENATAL VISIT NOTE  Subjective:  Alice Gibson is a 20 y.o. G3P0011 at [redacted]w[redacted]d being seen today for ongoing prenatal care.  She is currently monitored for the following issues for this high-risk pregnancy and has Morbid obesity (HCC); Asthma, mild persistent; Right ear pain; Eczema; Atopic dermatitis; Exercise-induced asthma; Chronic pain of left knee; Possible IBS (irritable bowel syndrome); Numbness and tingling of left side of face; Quit smoking; Dysmenorrhea; Chronic vomiting; Keloid; Supervision of normal pregnancy in second trimester; Pelvic pain during pregnancy; and Pregnancy affected by fetal growth restriction on their problem list.  Patient reports no complaints.  Contractions: Not present. Vag. Bleeding: None.  Movement: Present. Denies leaking of fluid.   The following portions of the patient's history were reviewed and updated as appropriate: allergies, current medications, past family history, past medical history, past social history, past surgical history and problem list.   Objective:   Vitals:   12/04/20 1135  BP: 104/69  Pulse: 99  Weight: 244 lb (110.7 kg)    Fetal Status:     Movement: Present     General:  Alert, oriented and cooperative. Patient is in no acute distress.  Skin: Skin is warm and dry. No rash noted.   Cardiovascular: Normal heart rate noted  Respiratory: Normal respiratory effort, no problems with respiration noted  Abdomen: Soft, gravid, appropriate for gestational age.  Pain/Pressure: Present     Pelvic: Cervical exam deferred        Extremities: Normal range of motion.     Mental Status: Normal mood and affect. Normal behavior. Normal judgment and thought content.   Assessment and Plan:  Pregnancy: G3P0011 at [redacted]w[redacted]d 1. Encounter for supervision of normal first pregnancy in second trimester Patient is doing well without complaints Cultures next visit  2. Pregnancy affected by fetal growth restriction Follow up ultrasound on  5/26  Preterm labor symptoms and general obstetric precautions including but not limited to vaginal bleeding, contractions, leaking of fluid and fetal movement were reviewed in detail with the patient. Please refer to After Visit Summary for other counseling recommendations.   Return in about 2 weeks (around 12/18/2020) for in person, ROB, High risk.  Future Appointments  Date Time Provider Department Center  12/07/2020  7:30 AM [facility] NURSE [facility]  12/07/2020  7:45 AM [facility] US5 [facility]  12/14/2020  2:30 PM [facility] NURSE [facility]  12/14/2020  2:45 PM [facility] US6 [facility]  12/20/2020 10:30 AM [facility] NURSE [facility]  12/20/2020 10:45 AM [facility] US4 [facility]  12/27/2020 11:15 AM [facility] NURSE [facility]  12/27/2020 11:30 AM [facility] US3 [facility]    Lajarvis Italiano, MD

## 2020-12-04 NOTE — Progress Notes (Signed)
Pt complains of pelvic pressure.

## 2020-12-07 ENCOUNTER — Encounter: Payer: Self-pay | Admitting: *Deleted

## 2020-12-07 ENCOUNTER — Ambulatory Visit: Payer: Medicaid Other | Attending: Obstetrics and Gynecology

## 2020-12-07 ENCOUNTER — Ambulatory Visit: Payer: Medicaid Other | Admitting: *Deleted

## 2020-12-07 ENCOUNTER — Other Ambulatory Visit: Payer: Self-pay

## 2020-12-07 DIAGNOSIS — Z3A34 34 weeks gestation of pregnancy: Secondary | ICD-10-CM

## 2020-12-07 DIAGNOSIS — O365990 Maternal care for other known or suspected poor fetal growth, unspecified trimester, not applicable or unspecified: Secondary | ICD-10-CM | POA: Diagnosis present

## 2020-12-07 DIAGNOSIS — O99213 Obesity complicating pregnancy, third trimester: Secondary | ICD-10-CM

## 2020-12-07 DIAGNOSIS — O365930 Maternal care for other known or suspected poor fetal growth, third trimester, not applicable or unspecified: Secondary | ICD-10-CM | POA: Diagnosis present

## 2020-12-14 ENCOUNTER — Encounter: Payer: Self-pay | Admitting: *Deleted

## 2020-12-14 ENCOUNTER — Other Ambulatory Visit: Payer: Self-pay

## 2020-12-14 ENCOUNTER — Ambulatory Visit: Payer: Medicaid Other | Admitting: *Deleted

## 2020-12-14 ENCOUNTER — Ambulatory Visit: Payer: Medicaid Other | Attending: Obstetrics and Gynecology

## 2020-12-14 DIAGNOSIS — O365990 Maternal care for other known or suspected poor fetal growth, unspecified trimester, not applicable or unspecified: Secondary | ICD-10-CM | POA: Diagnosis present

## 2020-12-14 DIAGNOSIS — O99213 Obesity complicating pregnancy, third trimester: Secondary | ICD-10-CM

## 2020-12-14 DIAGNOSIS — O365930 Maternal care for other known or suspected poor fetal growth, third trimester, not applicable or unspecified: Secondary | ICD-10-CM | POA: Insufficient documentation

## 2020-12-14 DIAGNOSIS — Z3A35 35 weeks gestation of pregnancy: Secondary | ICD-10-CM | POA: Diagnosis not present

## 2020-12-15 ENCOUNTER — Telehealth: Payer: Self-pay

## 2020-12-15 ENCOUNTER — Other Ambulatory Visit: Payer: Self-pay | Admitting: *Deleted

## 2020-12-15 DIAGNOSIS — O365930 Maternal care for other known or suspected poor fetal growth, third trimester, not applicable or unspecified: Secondary | ICD-10-CM

## 2020-12-15 NOTE — Telephone Encounter (Signed)
Mar/LM for patient (to advise of appointment time change on 12/20/2020-arrive@915a ) if patient calls back please advise. Thanks.

## 2020-12-18 ENCOUNTER — Other Ambulatory Visit: Payer: Self-pay

## 2020-12-18 ENCOUNTER — Ambulatory Visit (INDEPENDENT_AMBULATORY_CARE_PROVIDER_SITE_OTHER): Payer: Medicaid Other | Admitting: Obstetrics and Gynecology

## 2020-12-18 ENCOUNTER — Other Ambulatory Visit (HOSPITAL_COMMUNITY)
Admission: RE | Admit: 2020-12-18 | Discharge: 2020-12-18 | Disposition: A | Discharge status: Home / Self Care | Payer: Medicaid Other | Source: Ambulatory Visit | Attending: Obstetrics and Gynecology | Admitting: Obstetrics and Gynecology

## 2020-12-18 ENCOUNTER — Encounter: Payer: Self-pay | Admitting: Obstetrics and Gynecology

## 2020-12-18 DIAGNOSIS — Z3402 Encounter for supervision of normal first pregnancy, second trimester: Secondary | ICD-10-CM

## 2020-12-18 DIAGNOSIS — O365990 Maternal care for other known or suspected poor fetal growth, unspecified trimester, not applicable or unspecified: Secondary | ICD-10-CM

## 2020-12-18 DIAGNOSIS — Z3A36 36 weeks gestation of pregnancy: Secondary | ICD-10-CM

## 2020-12-18 NOTE — Progress Notes (Signed)
   PRENATAL VISIT NOTE  Subjective:  Alice Gibson is a 20 y.o. G3P0011 at [redacted]w[redacted]d being seen today for ongoing prenatal care.  She is currently monitored for the following issues for this high-risk pregnancy and has Morbid obesity (HCC); Asthma, mild persistent; Right ear pain; Eczema; Atopic dermatitis; Exercise-induced asthma; Chronic pain of left knee; Possible IBS (irritable bowel syndrome); Numbness and tingling of left side of face; Quit smoking; Dysmenorrhea; Chronic vomiting; Keloid; Supervision of normal pregnancy in second trimester; Pelvic pain during pregnancy; and Pregnancy affected by fetal growth restriction on their problem list.  Patient reports some cramping pain.  Contractions: Irritability. Vag. Bleeding: None.  Movement: Present. Denies leaking of fluid.   The following portions of the patient's history were reviewed and updated as appropriate: allergies, current medications, past family history, past medical history, past social history, past surgical history and problem list.   Objective:   Vitals:   12/18/20 1606  BP: 111/72  Pulse: 84  Weight: 249 lb (112.9 kg)   Fetal Status: Fetal Heart Rate (bpm): 160   Movement: Present     General:  Alert, oriented and cooperative. Patient is in no acute distress.  Skin: Skin is warm and dry. No rash noted.   Cardiovascular: Normal heart rate noted  Respiratory: Normal respiratory effort, no problems with respiration noted  Abdomen: Soft, gravid, appropriate for gestational age.  Pain/Pressure: Present     Pelvic: Cervical exam deferred        Extremities: Normal range of motion.     Mental Status: Normal mood and affect. Normal behavior. Normal judgment and thought content.   Assessment and Plan:  Pregnancy: G3P0011 at [redacted]w[redacted]d  1. Morbid obesity (HCC)  2. Encounter for supervision of normal first pregnancy in second trimester Per MFM, will decide on timing of delivery next [place] - Strep Gp B NAA - Cervicovaginal  ancillary only( [place])  3. Pregnancy affected by fetal growth restriction Last growth 5%tile Per MFM note, will decide on timing of delivery on next [place]  4. [redacted] weeks gestation of pregnancy   Preterm labor symptoms and general obstetric precautions including but not limited to vaginal bleeding, contractions, leaking of fluid and fetal movement were reviewed in detail with the patient. Please refer to After Visit Summary for other counseling recommendations.   Return in about 1 week (around 12/25/2020) for high OB, in person.  Future Appointments  Date Time Provider Department Center  12/20/2020  9:30 AM [facility] NURSE [facility]  12/20/2020  9:30 AM [facility] US3 [facility]  12/27/2020 11:15 AM [facility] NURSE [facility]  12/27/2020 11:30 AM [facility] US3 [facility]    Conan Bowens, MD

## 2020-12-19 LAB — CERVICOVAGINAL ANCILLARY ONLY
Chlamydia: NEGATIVE
Comment: NEGATIVE
Comment: NORMAL
Neisseria Gonorrhea: NEGATIVE

## 2020-12-20 ENCOUNTER — Ambulatory Visit: Payer: Medicaid Other | Attending: Obstetrics and Gynecology

## 2020-12-20 ENCOUNTER — Ambulatory Visit: Payer: Medicaid Other | Admitting: *Deleted

## 2020-12-20 ENCOUNTER — Other Ambulatory Visit: Payer: Self-pay

## 2020-12-20 DIAGNOSIS — O99213 Obesity complicating pregnancy, third trimester: Secondary | ICD-10-CM | POA: Diagnosis not present

## 2020-12-20 DIAGNOSIS — Z3A36 36 weeks gestation of pregnancy: Secondary | ICD-10-CM | POA: Diagnosis not present

## 2020-12-20 DIAGNOSIS — O365930 Maternal care for other known or suspected poor fetal growth, third trimester, not applicable or unspecified: Secondary | ICD-10-CM | POA: Diagnosis present

## 2020-12-20 DIAGNOSIS — O365990 Maternal care for other known or suspected poor fetal growth, unspecified trimester, not applicable or unspecified: Secondary | ICD-10-CM

## 2020-12-20 LAB — STREP GP B NAA: Strep Gp B NAA: NEGATIVE

## 2020-12-25 ENCOUNTER — Encounter: Payer: Self-pay | Admitting: Obstetrics and Gynecology

## 2020-12-25 ENCOUNTER — Ambulatory Visit (INDEPENDENT_AMBULATORY_CARE_PROVIDER_SITE_OTHER): Payer: Medicaid Other | Admitting: Obstetrics and Gynecology

## 2020-12-25 ENCOUNTER — Other Ambulatory Visit: Payer: Self-pay

## 2020-12-25 VITALS — BP 124/81 | HR 111 | Wt 248.0 lb

## 2020-12-25 DIAGNOSIS — Z3482 Encounter for supervision of other normal pregnancy, second trimester: Secondary | ICD-10-CM

## 2020-12-25 DIAGNOSIS — O365990 Maternal care for other known or suspected poor fetal growth, unspecified trimester, not applicable or unspecified: Secondary | ICD-10-CM

## 2020-12-25 NOTE — Progress Notes (Signed)
HROB c/o pressure, it feels she is about to start a period.

## 2020-12-25 NOTE — Patient Instructions (Signed)

## 2020-12-25 NOTE — Progress Notes (Signed)
Subjective:  DEMITRIA HAY is a 20 y.o. G3P0011 at [redacted]w[redacted]d being seen today for ongoing prenatal care.  She is currently monitored for the following issues for this high-risk pregnancy and has Morbid obesity (HCC); Asthma, mild persistent; Eczema; Atopic dermatitis; Exercise-induced asthma; Chronic pain of left knee; Possible IBS (irritable bowel syndrome); Numbness and tingling of left side of face; Quit smoking; Dysmenorrhea; Chronic vomiting; Keloid; Supervision of normal pregnancy in second trimester; Pelvic pain during pregnancy; and Pregnancy affected by fetal growth restriction on their problem list.  Patient reports general discomforts of pregnancy.  Contractions: Not present. Vag. Bleeding: None.  Movement: Present. Denies leaking of fluid.   The following portions of the patient's history were reviewed and updated as appropriate: allergies, current medications, past family history, past medical history, past social history, past surgical history and problem list. Problem list updated.  Objective:   Vitals:   12/25/20 1554  BP: 124/81  Pulse: (!) 111  Weight: 248 lb (112.5 kg)    Fetal Status: Fetal Heart Rate (bpm): 142   Movement: Present     General:  Alert, oriented and cooperative. Patient is in no acute distress.  Skin: Skin is warm and dry. No rash noted.   Cardiovascular: Normal heart rate noted  Respiratory: Normal respiratory effort, no problems with respiration noted  Abdomen: Soft, gravid, appropriate for gestational age. Pain/Pressure: Present     Pelvic:  Cervical exam deferred      Pt declined  Extremities: Normal range of motion.  Edema: None  Mental Status: Normal mood and affect. Normal behavior. Normal judgment and thought content.   Urinalysis:      Assessment and Plan:  Pregnancy: G3P0011 at [redacted]w[redacted]d  1. Encounter for supervision of other normal pregnancy in second trimester Stable Labor precautions  2. Pregnancy affected by fetal growth  restriction Growth scan this week Continue with weekly testing as per MFM Timing of IOL TBD by growth scan this week  Term labor symptoms and general obstetric precautions including but not limited to vaginal bleeding, contractions, leaking of fluid and fetal movement were reviewed in detail with the patient. Please refer to After Visit Summary for other counseling recommendations.  Return in about 1 week (around 01/01/2021) for OB visit, face to face, any provider.   Hermina Staggers, MD

## 2020-12-27 ENCOUNTER — Ambulatory Visit: Payer: Medicaid Other | Admitting: *Deleted

## 2020-12-27 ENCOUNTER — Ambulatory Visit: Payer: Medicaid Other | Attending: Obstetrics and Gynecology

## 2020-12-27 ENCOUNTER — Other Ambulatory Visit: Payer: Self-pay

## 2020-12-27 ENCOUNTER — Encounter: Payer: Self-pay | Admitting: *Deleted

## 2020-12-27 VITALS — BP 115/62 | HR 83

## 2020-12-27 DIAGNOSIS — O365930 Maternal care for other known or suspected poor fetal growth, third trimester, not applicable or unspecified: Secondary | ICD-10-CM | POA: Diagnosis not present

## 2020-12-27 DIAGNOSIS — O99213 Obesity complicating pregnancy, third trimester: Secondary | ICD-10-CM | POA: Insufficient documentation

## 2020-12-27 DIAGNOSIS — O365990 Maternal care for other known or suspected poor fetal growth, unspecified trimester, not applicable or unspecified: Secondary | ICD-10-CM | POA: Insufficient documentation

## 2020-12-27 DIAGNOSIS — Z3A37 37 weeks gestation of pregnancy: Secondary | ICD-10-CM | POA: Diagnosis not present

## 2021-01-01 ENCOUNTER — Ambulatory Visit (INDEPENDENT_AMBULATORY_CARE_PROVIDER_SITE_OTHER): Payer: Medicaid Other | Admitting: Obstetrics & Gynecology

## 2021-01-01 ENCOUNTER — Other Ambulatory Visit: Payer: Self-pay

## 2021-01-01 DIAGNOSIS — O365990 Maternal care for other known or suspected poor fetal growth, unspecified trimester, not applicable or unspecified: Secondary | ICD-10-CM | POA: Diagnosis not present

## 2021-01-01 DIAGNOSIS — Z3403 Encounter for supervision of normal first pregnancy, third trimester: Secondary | ICD-10-CM

## 2021-01-01 NOTE — Progress Notes (Signed)
+   Fetal movement. Pt c/o bilateral hand numbness for approximately 2 weeks.

## 2021-01-01 NOTE — Progress Notes (Signed)
   PRENATAL VISIT NOTE  Subjective:  Alice Gibson is a 20 y.o. G3P0011 at [redacted]w[redacted]d being seen today for ongoing prenatal care.  She is currently monitored for the following issues for this high-risk pregnancy and has Morbid obesity (HCC); Asthma, mild persistent; Eczema; Atopic dermatitis; Exercise-induced asthma; Chronic pain of left knee; Possible IBS (irritable bowel syndrome); Numbness and tingling of left side of face; Quit smoking; Chronic vomiting; Keloid; Encounter for supervision of normal first pregnancy in third trimester; and Pregnancy affected by fetal growth restriction on their problem list.  Patient reports no complaints.  Contractions: Irritability. Vag. Bleeding: None.  Movement: Present. Denies leaking of fluid.   The following portions of the patient's history were reviewed and updated as appropriate: allergies, current medications, past family history, past medical history, past social history, past surgical history and problem list.   Objective:   Vitals:   01/01/21 1541  BP: 126/77  Pulse: 88  Weight: 249 lb (112.9 kg)    Fetal Status: Fetal Heart Rate (bpm): 144   Movement: Present     General:  Alert, oriented and cooperative. Patient is in no acute distress.  Skin: Skin is warm and dry. No rash noted.   Cardiovascular: Normal heart rate noted  Respiratory: Normal respiratory effort, no problems with respiration noted  Abdomen: Soft, gravid, appropriate for gestational age.  Pain/Pressure: Present     Pelvic: Cervical exam deferred        Extremities: Normal range of motion.  Edema: Trace  Mental Status: Normal mood and affect. Normal behavior. Normal judgment and thought content.   Assessment and Plan:  Pregnancy: G3P0011 at [redacted]w[redacted]d 1. Morbid obesity (HCC)   2. Encounter for supervision of normal first pregnancy in third trimester High risk due to IUGR  3. Pregnancy affected by fetal growth restriction IOL this week as recommended  Term labor symptoms  and general obstetric precautions including but not limited to vaginal bleeding, contractions, leaking of fluid and fetal movement were reviewed in detail with the patient. Please refer to After Visit Summary for other counseling recommendations.   Return if symptoms worsen or fail to improve, for after delivery.  No future appointments.  Scheryl Darter, MD

## 2021-01-04 ENCOUNTER — Encounter (HOSPITAL_COMMUNITY): Payer: Self-pay | Admitting: Obstetrics & Gynecology

## 2021-01-04 ENCOUNTER — Inpatient Hospital Stay (HOSPITAL_COMMUNITY)
Admission: AD | Admit: 2021-01-04 | Discharge: 2021-01-06 | DRG: 807 | Disposition: A | Discharge status: Home / Self Care | Payer: Medicaid Other | Attending: Obstetrics & Gynecology | Admitting: Obstetrics & Gynecology

## 2021-01-04 ENCOUNTER — Inpatient Hospital Stay (HOSPITAL_COMMUNITY): Payer: Medicaid Other

## 2021-01-04 ENCOUNTER — Other Ambulatory Visit: Payer: Self-pay

## 2021-01-04 DIAGNOSIS — O9952 Diseases of the respiratory system complicating childbirth: Secondary | ICD-10-CM | POA: Diagnosis present

## 2021-01-04 DIAGNOSIS — O99214 Obesity complicating childbirth: Secondary | ICD-10-CM | POA: Diagnosis present

## 2021-01-04 DIAGNOSIS — Z20822 Contact with and (suspected) exposure to covid-19: Secondary | ICD-10-CM | POA: Diagnosis present

## 2021-01-04 DIAGNOSIS — J4530 Mild persistent asthma, uncomplicated: Secondary | ICD-10-CM | POA: Diagnosis present

## 2021-01-04 DIAGNOSIS — Z3A38 38 weeks gestation of pregnancy: Secondary | ICD-10-CM

## 2021-01-04 DIAGNOSIS — O4202 Full-term premature rupture of membranes, onset of labor within 24 hours of rupture: Secondary | ICD-10-CM | POA: Diagnosis not present

## 2021-01-04 DIAGNOSIS — Z87891 Personal history of nicotine dependence: Secondary | ICD-10-CM

## 2021-01-04 DIAGNOSIS — O365930 Maternal care for other known or suspected poor fetal growth, third trimester, not applicable or unspecified: Principal | ICD-10-CM | POA: Diagnosis present

## 2021-01-04 LAB — TYPE AND SCREEN
ABO/RH(D): A POS
Antibody Screen: NEGATIVE

## 2021-01-04 LAB — CBC
HCT: 39.4 % (ref 36.0–46.0)
Hemoglobin: 12.8 g/dL (ref 12.0–15.0)
MCH: 29.5 pg (ref 26.0–34.0)
MCHC: 32.5 g/dL (ref 30.0–36.0)
MCV: 90.8 fL (ref 80.0–100.0)
Platelets: 284 10*3/uL (ref 150–400)
RBC: 4.34 MIL/uL (ref 3.87–5.11)
RDW: 12.7 % (ref 11.5–15.5)
WBC: 11.7 10*3/uL — ABNORMAL HIGH (ref 4.0–10.5)
nRBC: 0 % (ref 0.0–0.2)

## 2021-01-04 LAB — RPR: RPR Ser Ql: NONREACTIVE

## 2021-01-04 LAB — RESP PANEL BY RT-PCR (FLU A&B, COVID) ARPGX2
Influenza A by PCR: NEGATIVE
Influenza B by PCR: NEGATIVE
SARS Coronavirus 2 by RT PCR: NEGATIVE

## 2021-01-04 MED ORDER — SOD CITRATE-CITRIC ACID 500-334 MG/5ML PO SOLN: 30.0000 mL | ORAL | Status: DC | PRN

## 2021-01-04 MED ORDER — OXYTOCIN BOLUS FROM INFUSION
333.0000 mL | Freq: Once | INTRAVENOUS | Status: AC
  Administered 2021-01-05: 333 mL via INTRAVENOUS

## 2021-01-04 MED ORDER — ONDANSETRON HCL 4 MG/2ML IJ SOLN
4.0000 mg | Freq: Four times a day (QID) | INTRAMUSCULAR | Status: DC | PRN
  Administered 2021-01-04 (×2): 4 mg via INTRAVENOUS
  Filled 2021-01-04 (×2): qty 2

## 2021-01-04 MED ORDER — ZOLPIDEM TARTRATE 5 MG PO TABS: 5.0000 mg | ORAL_TABLET | Freq: Every evening | ORAL | Status: DC | PRN

## 2021-01-04 MED ORDER — OXYTOCIN-SODIUM CHLORIDE 30-0.9 UT/500ML-% IV SOLN: 1.0000 m[IU]/min | INTRAVENOUS | Status: DC

## 2021-01-04 MED ORDER — OXYTOCIN-SODIUM CHLORIDE 30-0.9 UT/500ML-% IV SOLN: 2.5000 [IU]/h | INTRAVENOUS | Status: DC

## 2021-01-04 MED ORDER — MISOPROSTOL 50MCG HALF TABLET
50.0000 ug | ORAL_TABLET | ORAL | Status: DC
  Administered 2021-01-04 – 2021-01-05 (×4): 50 ug via BUCCAL
  Filled 2021-01-04 (×4): qty 1

## 2021-01-04 MED ORDER — LIDOCAINE HCL (PF) 1 % IJ SOLN: 30.0000 mL | INTRAMUSCULAR | Status: DC | PRN

## 2021-01-04 MED ORDER — FENTANYL CITRATE (PF) 100 MCG/2ML IJ SOLN
100.0000 ug | INTRAMUSCULAR | Status: DC | PRN
  Administered 2021-01-04 – 2021-01-05 (×4): 100 ug via INTRAVENOUS
  Filled 2021-01-04 (×4): qty 2

## 2021-01-04 MED ORDER — TERBUTALINE SULFATE 1 MG/ML IJ SOLN: 0.2500 mg | Freq: Once | INTRAMUSCULAR | Status: DC | PRN

## 2021-01-04 MED ORDER — MISOPROSTOL 50MCG HALF TABLET
50.0000 ug | ORAL_TABLET | Freq: Once | ORAL | Status: AC
  Administered 2021-01-04: 50 ug via BUCCAL
  Filled 2021-01-04: qty 1

## 2021-01-04 MED ORDER — LACTATED RINGERS IV SOLN
500.0000 mL | INTRAVENOUS | Status: DC | PRN
  Administered 2021-01-04 (×2): 500 mL via INTRAVENOUS

## 2021-01-04 MED ORDER — ACETAMINOPHEN 325 MG PO TABS: 650.0000 mg | ORAL_TABLET | ORAL | Status: DC | PRN

## 2021-01-04 MED ORDER — LACTATED RINGERS IV SOLN: INTRAVENOUS | Status: DC

## 2021-01-04 NOTE — H&P (Addendum)
OBSTETRIC ADMISSION HISTORY AND PHYSICAL  Alice Gibson is a 20 y.o. female G54P0011 with IUP at [redacted]w[redacted]d by 15w [place] presenting for IOL for FGR (6%ile). She reports +FMs, No LOF, no VB.  She plans on breast feeding. She is undecided for birth control, plans to discuss with PCP. She received her prenatal care at [facility]   Dating: By [place] at [redacted]w[redacted]d --->  Estimated Date of Delivery: 01/14/21  Sono:    @[redacted]w[redacted]d , CWD, normal anatomy, cephalic presentation, anterior lie, 2519g, 6% EFW   Prenatal History/Complications:  FGR Obesity Asthma  Past Medical History: Past Medical History:  Diagnosis Date   Abnormal urination 04/26/2014   Allergic rhinitis due to cats 05/02/2016   Allergic to animal dander 05/02/2016   Equivocal reactivity to Cat and to Dog danders on skin-prick testing by Dr 05/04/2016 (Allergist, 01/2008)   Allergy to mold 05/02/2016   Equivocal reactivity to mold on skin-prick testing by Dr 05/04/2016 (Allergist, 01/2008)   Allergy to pollen 05/02/2016   Strong allergic response to skin-prick testing ( Dr 05/04/2016 (Allergist) 01/2008) to tree pollens   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   Asthma    Asthma, mild persistent 03/12/2007   Qualifier: Diagnosis of  By: 03/14/2007 NP, Humberto Seals      ASTHMA, PERSISTENT 03/12/2007   Qualifier: Diagnosis of  By: 03/14/2007 NP, Susan      Back pain 05/18/2013   CHILDHOOD OBESITY 10/27/2008   Qualifier: Diagnosis of  By: McDiarmid MD, Todd     Constipation 10/05/2013   DERMATITIS, ATOPIC 12/18/2007   Qualifier: Diagnosis of  By: McDiarmid MD, Todd     Disordered sleep 12/02/2016   Disordered sleep 12/02/2016   Eczema 11/28/2015   Exercise-induced asthma 05/02/2016   Expressive language disorder 07/30/2010   Qualifier: Diagnosis of  By: McDiarmid MD, 08/01/2010 MAL SEIZURE 08/22/2009   Qualifier: History of  By: McDiarmid MD, Todd     History of atopic dermatitis 12/18/2007   History of atopic dermatitis as child.      History of atopic dermatitis  12/18/2007   History of atopic dermatitis as child.      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 (Neuro)     Major depressive disorder in remission (HCC) 07/21/2015   Major depressive disorder in remission (HCC) 07/21/2015   MDD (major depressive disorder), recurrent episode, severe (HCC) 04/27/2015   Obesity 10/27/2008   Qualifier: Diagnosis of  By: McDiarmid MD, 10/29/2008 of right ear 07/21/2015   Peanut allergy 10/05/2013   Scoliosis, adolescent acquired 09/11/2015   SEIZURE DISORDER, HX OF 04/02/2007   Qualifier: Diagnosis of  By: McDiarmid MD, Todd     Seizures [facility])    Severe episode of recurrent major depressive disorder, without psychotic features (HCC)    Smoking 07/21/2015   Superficial acne vulgaris 05/02/2016   Syncope 07/21/2015    Past Surgical History: Past Surgical History:  Procedure Laterality Date   NO PAST SURGERIES      Obstetrical History: OB History     Gravida  3   Para  1   Term      Preterm      AB  1   Living  1      SAB  1   IAB      Ectopic  Multiple  0   Live Births  1           Social History Social History   Socioeconomic History   Marital status: Single    Spouse name: Not on file   Number of children: Not on file   Years of education: Not on file   Highest education level: Not on file  Occupational History   Not on file  Tobacco Use   Smoking status: Former    Pack years: 0.00    Types: Cigarettes   Smokeless tobacco: Never  Vaping Use   Vaping Use: Never used  Substance and Sexual Activity   Alcohol use: Not Currently    Comment: occassionally   Drug use: Not Currently    Types: Marijuana   Sexual activity: Yes  Other Topics Concern   Not on file  Social History Narrative    Temporary court-appointed guardians, patient's sister, Alice Gibson, and patient's brother-in-law, Location manager.    Mother with severe medical and mental illnesses that have placed great stress on patient as child and adolescent.    Alice Gibson has been sexually active      Social Determinants of Corporate investment banker Strain: Not on file  Food Insecurity: Not on file  Transportation Needs: Not on file  Physical Activity: Not on file  Stress: Not on file  Social Connections: Not on file    Family History: Family History  Problem Relation Age of Onset   Alcohol abuse Mother 53   Drug abuse Mother 32   Mental illness Mother 7   Clotting disorder Mother 60       Hypercoagulopathy undefined but causing large aortic thromboemboli   Kidney disease Father    Diabetes Maternal Grandmother    Colon cancer Neg Hx    Esophageal cancer Neg Hx    Pancreatic cancer Neg Hx    Stomach cancer Neg Hx     Allergies: Allergies  Allergen Reactions   Valproic Acid Other (See Comments)    REACTION: essential tremor    Medications Prior to Admission  Medication Sig Dispense Refill Last Dose   albuterol (VENTOLIN HFA) 108 (90 Base) MCG/ACT inhaler Inhale 2 puffs into the lungs every 6 (six) hours as needed for wheezing or shortness of breath. 18 g 5    cetirizine (ZYRTEC) 10 MG tablet Take 1 tablet (10 mg total) by mouth daily. 30 tablet 11    Prenatal Vit-Fe Fumarate-FA (MULTIVITAMIN-PRENATAL) 27-0.8 MG TABS tablet Take 1 tablet by mouth daily at 12 noon.        Review of Systems   All systems reviewed and negative except as stated in HPI  Blood pressure (!) 126/53, pulse 100, temperature 98.7 F (37.1 C), temperature source Oral, resp. rate 17, height 4\' 11"  (1.499 m), weight 112.5 kg, last menstrual period 04/24/2020, SpO2 100 %, unknown if currently breastfeeding. General appearance: alert, cooperative, and appears stated age Lungs: non-labored respirations Heart: regular rate Abdomen: gravid Presentation: cephalic Fetal monitoringBaseline: 140 bpm, Variability: Good {> 6 bpm), Accelerations:  Reactive, and Decelerations: Variable:   Uterine activity occasional Dilation: Fingertip Effacement (%): Thick Exam by:: Dr. [email or web address]   Prenatal labs: ABO, Rh: --/--/A POS (06/23 05-24-1974) Antibody: NEG (06/23 0905) Rubella: 2.96 (12/28 1345) RPR: Non Reactive (04/04 1102)  HBsAg: Negative (12/28 1345)  HIV: Non Reactive (04/04 1102)  GBS: Negative/-- (06/06 0411)  1 hr Glucola nl Genetic screening  NIPS low risk Anatomy 05-07-1993 normal  Prenatal Transfer Tool  Maternal  Diabetes: No Genetic Screening: Normal Maternal Ultrasounds/Referrals: IUGR Fetal Ultrasounds or other Referrals:  Referred to Materal Fetal Medicine  Maternal Substance Abuse:  No Significant Maternal Medications:  None Significant Maternal Lab Results: Group B Strep negative  Results for orders placed or performed during the hospital encounter of 01/04/21 (from the past 24 hour(s))  Resp Panel by RT-PCR (Flu A&B, Covid) Nasopharyngeal Swab   Collection Time: 01/04/21  8:26 AM   Specimen: Nasopharyngeal Swab; Nasopharyngeal(NP) swabs in vial transport medium  Result Value Ref Range   SARS Coronavirus 2 by RT PCR NEGATIVE NEGATIVE   Influenza A by PCR NEGATIVE NEGATIVE   Influenza B by PCR NEGATIVE NEGATIVE  Type and screen [facility]   Collection Time: 01/04/21  9:05 AM  Result Value Ref Range   ABO/RH(D) A POS    Antibody Screen NEG    Sample Expiration      01/07/2021,2359 Performed at [facility], [address] [place], [place] [address]   CBC   Collection Time: 01/04/21  9:10 AM  Result Value Ref Range   WBC 11.7 (H) 4.0 - 10.5 K/uL   RBC 4.34 3.87 - 5.11 MIL/uL   Hemoglobin 12.8 12.0 - 15.0 g/dL   HCT 17.6 16.0 - 73.7 %   MCV 90.8 80.0 - 100.0 fL   MCH 29.5 26.0 - 34.0 pg   MCHC 32.5 30.0 - 36.0 g/dL   RDW 10.6 26.9 - 48.5 %   Platelets 284 150 - 400 K/uL   nRBC 0.0 0.0 - 0.2 %    Patient Active Problem List   Diagnosis Date Noted   Labor and delivery indication for care  or intervention 01/04/2021   Pregnancy affected by fetal growth restriction 12/04/2020   Encounter for supervision of normal first pregnancy in third trimester 07/11/2020   Keloid 05/06/2019   Chronic vomiting 03/16/2019   Quit smoking 03/15/2019   Numbness and tingling of left side of face 11/24/2018   Possible IBS (irritable bowel syndrome) 05/29/2018   Chronic pain of left knee 11/27/2017   Atopic dermatitis 05/02/2016   Exercise-induced asthma 05/02/2016   Eczema 11/28/2015   Morbid obesity (HCC) 10/27/2008   Asthma, mild persistent 03/12/2007    Assessment/Plan:  SEBA MADOLE is a 20 y.o. G3P1011 at [redacted]w[redacted]d here for IOL secondary to FGR (6%ile)  #IOL: Will start cervical ripening with misoprostol. Pitocin/AROM prn when cervix favorable. Anticipate NSVD #Pain: Per patient request, will try to avoid epidural #FWB: Cat I #ID:  GBS negative #MOF: breast #MOC:undecided, plans to discuss with PCP #Circ:  No #asthma: avoid carboprost  Littie Deeds, MD  01/04/2021, 10:24 AM   I saw and evaluated the patient. I agree with the findings and the plan of care as documented in the resident's note.  Casper Harrison, MD [facility] Family Medicine Fellow, [facility] for [facility], [facility]

## 2021-01-04 NOTE — Progress Notes (Addendum)
Labor Progress Note Alice Gibson is a 20 y.o. G3P0011 at [redacted]w[redacted]d presented for IOL secondary to FGR S: Not feeling many contractions  O:  BP (!) 113/52 (BP Location: Right Arm)   Pulse 71   Temp 97.9 F (36.6 C) (Oral)   Resp 18   Ht 4\' 11"  (1.499 m)   Wt 112.5 kg   LMP 04/24/2020 (Within Weeks)   SpO2 100%   BMI 50.09 kg/m  EFM: baseline 130/moderate variability/+accels/no decels Toco: occasional  CVE: Dilation: Fingertip Effacement (%): Thick Cervical Position: Posterior Presentation: Vertex Exam by:: Dr. [email or web address]   A&P: 20 y.o. 26 [redacted]w[redacted]d IOL for FGR (6%ile) #IOL: No significant change, will administer a second dose of Cytotec. Consider FB at next check. #Pain: per patient request #FWB: cat I #GBS negative  [redacted]w[redacted]d, MD 2:30 PM   I saw and evaluated the patient. I agree with the findings and the plan of care as documented in the resident's note.  Littie Deeds, MD [facility] Family Medicine Fellow, [facility] for [facility], [facility]

## 2021-01-04 NOTE — Progress Notes (Signed)
Labor Progress Note Alice Gibson is a 20 y.o. G3P0011 at [redacted]w[redacted]d presented for IOL secondary to FGR S: Just took a nap, no concerns at this time.  O:  BP 112/60 (BP Location: Right Arm)   Pulse 60   Temp 97.8 F (36.6 C) (Oral)   Resp 17   Ht 4\' 11"  (1.499 m)   Wt 112.5 kg   LMP 04/24/2020 (Within Weeks)   SpO2 100%   BMI 50.09 kg/m  EFM: baseline 150/moderate variability/+accels/variable decels Toco: occasional  CVE: Dilation: Fingertip Effacement (%): Thick Cervical Position: Posterior Presentation: Vertex Exam by:: Dr. [email or web address]   A&P: 20 y.o. 26 [redacted]w[redacted]d IOL for FGR (6%ile) #IOL: No significant change, will administer a third dose of Cytotec. Consider FB at next check pending cervical change/position. #Pain: per patient request #FWB: cat II (occasional variable decels, overall category I) #GBS negative  [redacted]w[redacted]d, MD 6:38 PM

## 2021-01-04 NOTE — Progress Notes (Signed)
Patient Vitals for the past 4 hrs:  BP Temp Temp src Pulse Resp  01/04/21 2315 (!) 118/55 98.2 F (36.8 C) Oral 71 18  01/04/21 2100 127/71 98.2 F (36.8 C) Oral 82 18   Cx 1/thick/-2.  Foley placed and inflated w/60cc H20.  FHR Cat 1.  Will continue buccal cytotec until foley falls out and cx is >/= 50% effaced.

## 2021-01-05 ENCOUNTER — Encounter (HOSPITAL_COMMUNITY): Payer: Self-pay | Admitting: Obstetrics and Gynecology

## 2021-01-05 DIAGNOSIS — O4202 Full-term premature rupture of membranes, onset of labor within 24 hours of rupture: Secondary | ICD-10-CM

## 2021-01-05 DIAGNOSIS — Z3A38 38 weeks gestation of pregnancy: Secondary | ICD-10-CM

## 2021-01-05 DIAGNOSIS — O365930 Maternal care for other known or suspected poor fetal growth, third trimester, not applicable or unspecified: Secondary | ICD-10-CM

## 2021-01-05 MED ORDER — TERBUTALINE SULFATE 1 MG/ML IJ SOLN: 0.2500 mg | Freq: Once | INTRAMUSCULAR | Status: DC | PRN

## 2021-01-05 MED ORDER — PHENYLEPHRINE 40 MCG/ML (10ML) SYRINGE FOR IV PUSH (FOR BLOOD PRESSURE SUPPORT): 80.0000 ug | PREFILLED_SYRINGE | INTRAVENOUS | Status: DC | PRN

## 2021-01-05 MED ORDER — DIPHENHYDRAMINE HCL 25 MG PO CAPS: 25.0000 mg | ORAL_CAPSULE | Freq: Four times a day (QID) | ORAL | Status: DC | PRN

## 2021-01-05 MED ORDER — COCONUT OIL OIL: 1.0000 "application " | TOPICAL_OIL | Status: DC | PRN

## 2021-01-05 MED ORDER — OXYCODONE HCL 5 MG PO TABS
5.0000 mg | ORAL_TABLET | ORAL | Status: DC | PRN
  Administered 2021-01-05: 5 mg via ORAL
  Filled 2021-01-05: qty 1

## 2021-01-05 MED ORDER — LACTATED RINGERS IV SOLN: 500.0000 mL | Freq: Once | INTRAVENOUS | Status: DC

## 2021-01-05 MED ORDER — SIMETHICONE 80 MG PO CHEW: 80.0000 mg | CHEWABLE_TABLET | ORAL | Status: DC | PRN

## 2021-01-05 MED ORDER — IBUPROFEN 600 MG PO TABS
600.0000 mg | ORAL_TABLET | Freq: Four times a day (QID) | ORAL | Status: DC
  Administered 2021-01-05 – 2021-01-06 (×5): 600 mg via ORAL
  Filled 2021-01-05 (×5): qty 1

## 2021-01-05 MED ORDER — SENNOSIDES-DOCUSATE SODIUM 8.6-50 MG PO TABS
2.0000 | ORAL_TABLET | Freq: Every day | ORAL | Status: DC
  Administered 2021-01-06: 2 via ORAL
  Filled 2021-01-05: qty 2

## 2021-01-05 MED ORDER — PRENATAL MULTIVITAMIN CH
1.0000 | ORAL_TABLET | Freq: Every day | ORAL | Status: DC
  Administered 2021-01-06: 1 via ORAL
  Filled 2021-01-05: qty 1

## 2021-01-05 MED ORDER — DIPHENHYDRAMINE HCL 50 MG/ML IJ SOLN: 12.5000 mg | INTRAMUSCULAR | Status: DC | PRN

## 2021-01-05 MED ORDER — ACETAMINOPHEN 325 MG PO TABS
650.0000 mg | ORAL_TABLET | ORAL | Status: DC
  Administered 2021-01-05 – 2021-01-06 (×5): 650 mg via ORAL
  Filled 2021-01-05 (×5): qty 2

## 2021-01-05 MED ORDER — TETANUS-DIPHTH-ACELL PERTUSSIS 5-2.5-18.5 LF-MCG/0.5 IM SUSY: 0.5000 mL | PREFILLED_SYRINGE | Freq: Once | INTRAMUSCULAR | Status: DC

## 2021-01-05 MED ORDER — DIBUCAINE (PERIANAL) 1 % EX OINT: 1.0000 "application " | TOPICAL_OINTMENT | CUTANEOUS | Status: DC | PRN

## 2021-01-05 MED ORDER — ONDANSETRON HCL 4 MG PO TABS: 4.0000 mg | ORAL_TABLET | ORAL | Status: DC | PRN

## 2021-01-05 MED ORDER — OXYCODONE HCL 5 MG PO TABS
10.0000 mg | ORAL_TABLET | ORAL | Status: DC | PRN
Start: 2021-01-05 — End: 2021-01-06

## 2021-01-05 MED ORDER — WITCH HAZEL-GLYCERIN EX PADS: 1.0000 "application " | MEDICATED_PAD | CUTANEOUS | Status: DC | PRN

## 2021-01-05 MED ORDER — BENZOCAINE-MENTHOL 20-0.5 % EX AERO
1.0000 "application " | INHALATION_SPRAY | CUTANEOUS | Status: DC | PRN
  Filled 2021-01-05: qty 56

## 2021-01-05 MED ORDER — ONDANSETRON HCL 4 MG/2ML IJ SOLN: 4.0000 mg | INTRAMUSCULAR | Status: DC | PRN

## 2021-01-05 MED ORDER — OXYTOCIN-SODIUM CHLORIDE 30-0.9 UT/500ML-% IV SOLN
1.0000 m[IU]/min | INTRAVENOUS | Status: DC
  Administered 2021-01-05: 2 m[IU]/min via INTRAVENOUS
  Filled 2021-01-05: qty 500

## 2021-01-05 MED ORDER — FENTANYL-BUPIVACAINE-NACL 0.5-0.125-0.9 MG/250ML-% EP SOLN
12.0000 mL/h | EPIDURAL | Status: DC | PRN
  Filled 2021-01-05: qty 250

## 2021-01-05 MED ORDER — EPHEDRINE 5 MG/ML INJ: 10.0000 mg | INTRAVENOUS | Status: DC | PRN

## 2021-01-05 NOTE — Lactation Note (Signed)
This note was copied from a baby's chart. Lactation Consultation Note  Patient Name: Alice Gibson BTDHR'C Date: 01/05/2021 Reason for consult: L&D Initial assessment;Early term 37-38.6wks;Other (Comment) (P 2, baby STS on moms chest when LC entered the room, alert / calm starting to root. LC   to assist / mom receptive.baby latched easily  LT breast / laid back / fed 15 mins/ few swallows/  per mom comfortable/nipple rounded when baby released.) Age: LC visit at 40 mins PP  LC reviewed the feeding goals - 8-12 times in 24 hours with feeding cues.  LC reassured mom baby has time to get organized with breast feeding and he is off to a good start. Latch score 8    Maternal Data - mom expressed concerns for milk supply due to her experience with her 1st baby.  Has patient been taught Hand Expression?: Yes (per mom few breast changes with 1st trimester) Does the patient have breastfeeding experience prior to this delivery?: Yes How long did the patient breastfeed?: per mom attempted/ formula fed early  Feeding Mother's Current Feeding Choice: Breast Milk  LATCH Score Latch: Grasps breast easily, tongue down, lips flanged, rhythmical sucking.  Audible Swallowing: A few with stimulation  Type of Nipple: Everted at rest and after stimulation  Comfort (Breast/Nipple): Soft / non-tender  Hold (Positioning): Assistance needed to correctly position infant at breast and maintain latch.  LATCH Score: 8   Lactation Tools Discussed/Used    Interventions Interventions: Breast feeding basics reviewed;Assisted with latch;Skin to skin;Breast massage;Breast compression;Adjust position;Support pillows;Education  Discharge    Consult Status Consult Status: Follow-up Date: 01/05/21 Follow-up type: In-patient    [name] [name] 01/05/2021, 12:20 PM

## 2021-01-05 NOTE — Discharge Summary (Addendum)
Postpartum Discharge Summary     Patient Name: Alice Gibson DOB: 05-Mar-2001 MRN: [id number]  Date of admission: 01/04/2021 Delivery date:01/05/2021  Delivering provider: Randa Ngo  Date of discharge: 01/06/2021  Admitting diagnosis: Labor and delivery indication for care or intervention [O75.9] Intrauterine pregnancy: [redacted]w[redacted]d     Secondary diagnosis:  Principal Problem:   Vaginal delivery Active Problems:   Asthma, mild persistent   Labor and delivery indication for care or intervention  Additional problems: as noted above Discharge diagnosis: Term Pregnancy Delivered                                              Post partum procedures: none Augmentation: Pitocin, Cytotec, and IP Foley Complications: None  Hospital course: Induction of Labor With Vaginal Delivery   20 y.o. yo [id number] at [redacted]w[redacted]d was admitted to the hospital 01/04/2021 for induction of labor.  Indication for induction:  IUGR .  Pt received cytotec on admission and FB was placed. Pt was transitioned to pitocin and had SROM for clear fluid. She then progressed to complete cervical dilation. Patient had an uncomplicated labor course as follows: Membrane Rupture Time/Date: 9:53 AM ,01/05/2021   Delivery Method:Vaginal, Spontaneous  Episiotomy: None  Lacerations:  None  Details of delivery can be found in separate delivery note.  Patient had a routine postpartum course. Patient is discharged home 01/06/21.  Newborn Data: Birth date:01/05/2021  Birth time:11:05 AM  Gender:Female  Living status:Living  Apgars:8 ,9  Weight:2750 g   Magnesium Sulfate received: No BMZ received: No Rhophylac:N/A MMR:N/A T-DaP:Given prenatally Flu: Yes Transfusion:No  Physical exam  Vitals:   01/05/21 1800 01/05/21 2231 01/06/21 0202 01/06/21 0505  BP: 136/78 (!) 106/57 108/60 (!) 101/55  Pulse: 80 60 75 65  Resp: $Remo'16 17 18 18  'CerED$ Temp: 98.7 F (37.1 C) 98.5 F (36.9 C) 98.2 F (36.8 C) 98 F (36.7 C)  TempSrc: Oral Oral  Oral Oral  SpO2: 100% 100% 100% 100%  Weight:      Height:       General: alert, cooperative, and no distress Lochia: appropriate Uterine Fundus: firm Incision: N/A DVT Evaluation: No significant calf/ankle edema. Labs: Lab Results  Component Value Date   WBC 11.7 (H) 01/04/2021   HGB 12.8 01/04/2021   HCT 39.4 01/04/2021   MCV 90.8 01/04/2021   PLT 284 01/04/2021   CMP Latest Ref Rng & Units 01/21/2020  Glucose 70 - 99 mg/dL 93  BUN 6 - 20 mg/dL 8  Creatinine 0.44 - 1.00 mg/dL 0.63  Sodium 135 - 145 mmol/L 137  Potassium 3.5 - 5.1 mmol/L 4.1  Chloride 98 - 111 mmol/L 107  CO2 22 - 32 mmol/L 21(L)  Calcium 8.9 - 10.3 mg/dL 8.7(L)  Total Protein 6.5 - 8.1 g/dL 6.7  Total Bilirubin 0.3 - 1.2 mg/dL 0.5  Alkaline Phos 38 - 126 U/L 53  AST 15 - 41 U/L 16  ALT 0 - 44 U/L 17   Edinburgh Score: Edinburgh Postnatal Depression Scale Screening Tool 07/11/2017  I have been able to laugh and see the funny side of things. 0  I have looked forward with enjoyment to things. 0  I have blamed myself unnecessarily when things went wrong. 1  I have been anxious or worried for no good reason. 0  I have felt scared or panicky for no  good reason. 0  Things have been getting on top of me. 0  I have been so unhappy that I have had difficulty sleeping. 0  I have felt sad or miserable. 0  I have been so unhappy that I have been crying. 0  The thought of harming myself has occurred to me. 0  Edinburgh Postnatal Depression Scale Total 1     After visit meds:  Allergies as of 01/06/2021       Reactions   Valproic Acid Other (See Comments)   REACTION: essential tremor        Medication List     TAKE these medications    acetaminophen 325 MG tablet Commonly known as: Tylenol Take 2 tablets (650 mg total) by mouth every 4 (four) hours.   albuterol 108 (90 Base) MCG/ACT inhaler Commonly known as: VENTOLIN HFA Inhale 2 puffs into the lungs every 6 (six) hours as needed for wheezing  or shortness of breath.   cetirizine 10 MG tablet Commonly known as: ZYRTEC Take 1 tablet (10 mg total) by mouth daily.   coconut oil Oil Apply 1 application topically as needed.   ibuprofen 600 MG tablet Commonly known as: ADVIL Take 1 tablet (600 mg total) by mouth every 6 (six) hours.   multivitamin-prenatal 27-0.8 MG Tabs tablet Take 1 tablet by mouth daily at 12 noon.         Discharge home in stable condition Infant Feeding: Breast Infant Disposition:home with mother Discharge instruction: per After Visit Summary and Postpartum booklet. Activity: Advance as tolerated. Pelvic rest for 6 weeks.  Diet: routine diet Future Appointments:No future appointments. Follow up Visit: Message sent to [facility] by Dr. Astrid Drafts.  Please schedule this patient for a In person postpartum visit in 6 weeks with the following provider: Any provider. Additional Postpartum F/U: none   Low risk pregnancy complicated by:  IUGR, asthma Delivery mode:  Vaginal, Spontaneous  Anticipated Birth Control:  Unsure, plans to discuss with PCP   01/06/2021 Zola Button, MD    I have seen and examined this patient and agree with above documentation in the resident's note.     Renee Harder, MSN, CNM 01/06/21 11:34 AM

## 2021-01-05 NOTE — Progress Notes (Signed)
Labor Progress Note Alice Gibson is a 20 y.o. G3P0011 at [redacted]w[redacted]d presented for IOL secondary to FGR S: starting to feel contractions more  O:  BP (!) 108/56   Pulse 65   Temp 99.2 F (37.3 C) (Oral)   Resp 16   Ht 4\' 11"  (1.499 m)   Wt 112.5 kg   LMP 04/24/2020 (Within Weeks)   SpO2 100%   BMI 50.09 kg/m  EFM: baseline 125/moderate variability/+accels/no decels Toco: every 3-4 min  CVE: Dilation: 4.5 Effacement (%): 50 Cervical Position: Posterior Station: -2 Presentation: Vertex Exam by:: J.Cox, RN   A&P: 19 y.o. [phone] [redacted]w[redacted]d IOL for FGR (6%ile) #IOL: Progressing well. S/p Cytotec x5. FB recently out (at 0810) so will start Pitocin [redacted]w[redacted]d), currently at 2 mu/min. #Pain: per patient request, not planning epidural #FWB: category I #GBS negative  (1552, MD 9:09 AM

## 2021-01-06 MED ORDER — COCONUT OIL OIL
1.0000 | TOPICAL_OIL | 0 refills | Status: DC | PRN
Start: 2021-01-06 — End: 2021-09-11

## 2021-01-06 MED ORDER — ACETAMINOPHEN 325 MG PO TABS: 650.0000 mg | ORAL_TABLET | ORAL | Status: DC

## 2021-01-06 MED ORDER — IBUPROFEN 600 MG PO TABS: 600.0000 mg | ORAL_TABLET | Freq: Four times a day (QID) | ORAL | 0 refills | Status: DC

## 2021-01-06 NOTE — Lactation Note (Signed)
This note was copied from a baby's chart. Lactation Consultation Note  Patient Name: Alice Gibson Date: 01/06/2021 Reason for consult: Initial assessment;Early term 37-38.6wks;Primapara;1st time breastfeeding Age:21 hours   P1 mother whose infant is now 84 hours old.  This is an ETI at 38+5 weeks.  Mother requested latch assistance.  Returned after baby's bath for assistance.  Taught hand expression.  Mother unable to express colostrum drops at this time.  Assisted to latch easily to the right breast in the cross cradle hold.  Baby had a wide gape and flanged lips; began sucking immediately.  Mother denied breast pain but felt strong uterine contractions.  Observed him feeding for 20 minutes while reviewing breast feeding basics.    Encouraged to feed 8-12 times/24 hours or sooner if baby shows cues.  Mother will express before/after feedings to help ensure a good milk supply.  Mother had been set up with a DEBP per request.  Reviewed pump settings and flange size.  #24 is appropriate at this time.  Family member (currently breast feeding her own 47 months old son) here to assist with newborn care and breast feeding. Father present.  Suggested mother call her RN/LC as needed for latch assistance.  Showed father how he can assist mother with breast feeding.  Family members supportive.   Maternal Data Has patient been taught Hand Expression?: Yes Does the patient have breastfeeding experience prior to this delivery?: No  Feeding Mother's Current Feeding Choice: Breast Milk  LATCH Score Latch: Grasps breast easily, tongue down, lips flanged, rhythmical sucking.  Audible Swallowing: A few with stimulation  Type of Nipple: Everted at rest and after stimulation  Comfort (Breast/Nipple): Soft / non-tender  Hold (Positioning): Assistance needed to correctly position infant at breast and maintain latch.  LATCH Score: 8   Lactation Tools Discussed/Used Tools: Pump;Coconut  oil Breast pump type: Manual;Double-Electric Breast Pump Pump Education: Setup, frequency, and cleaning (Reviewed) Reason for Pumping: Mother's request Pumping frequency: prn  Interventions Interventions: Breast feeding basics reviewed;Skin to skin;Assisted with latch;Breast massage;Hand express;Breast compression;Adjust position;Hand pump;Coconut oil;Position options;Support pillows;Education  Discharge Pump: DEBP;Manual  Consult Status Consult Status: Follow-up Date: 01/07/21 Follow-up type: In-patient    Alice Gibson 01/06/2021, 12:22 PM

## 2021-01-06 NOTE — Lactation Note (Signed)
This note was copied from a baby's chart. Lactation Consultation Note  Patient Name: Alice Gibson [id number] Date: 01/06/2021 Reason for consult: Initial assessment;Early term 37-38.6wks Age:21 hours   Initial LC Consult:  LC order entered at 10  Mother was observing baby receiving his bath when I arrived.  Asked her to have her RN call for my assistance when baby was ready to latch.  Mother will call when ready.     Maternal Data    Feeding Mother's Current Feeding Choice: Breast Milk  LATCH Score                    Lactation Tools Discussed/Used    Interventions    Discharge    Consult Status Consult Status: Follow-up Date: 01/06/21 Follow-up type: In-patient    Jarion Hawthorne R Dequincy Born 01/06/2021, 11:10 AM

## 2021-01-17 ENCOUNTER — Telehealth (HOSPITAL_COMMUNITY): Payer: Self-pay

## 2021-01-17 NOTE — Telephone Encounter (Signed)
No answer. Message left to return nurse call.   Marcelino Duster [facility] 01/17/2021,1757

## 2021-02-06 ENCOUNTER — Ambulatory Visit: Payer: Medicaid Other | Admitting: Obstetrics & Gynecology

## 2021-03-16 ENCOUNTER — Ambulatory Visit (INDEPENDENT_AMBULATORY_CARE_PROVIDER_SITE_OTHER): Payer: Medicaid Other | Admitting: Family Medicine

## 2021-03-16 ENCOUNTER — Encounter: Payer: Self-pay | Admitting: Family Medicine

## 2021-03-16 ENCOUNTER — Other Ambulatory Visit: Payer: Self-pay

## 2021-03-16 VITALS — BP 108/78 | HR 78 | Wt 261.4 lb

## 2021-03-16 DIAGNOSIS — Z30011 Encounter for initial prescription of contraceptive pills: Secondary | ICD-10-CM | POA: Diagnosis not present

## 2021-03-16 MED ORDER — NORGESTIMATE-ETH ESTRADIOL 0.25-35 MG-MCG PO TABS: 1.0000 | ORAL_TABLET | Freq: Every day | ORAL | 11 refills | Status: DC

## 2021-03-16 NOTE — Progress Notes (Signed)
    SUBJECTIVE:   CHIEF COMPLAINT / HPI:   Discuss Contraception Patient here to discuss contraception options. She is 3 months postpartum. Initially wanted to do IUD, but changed her mind. Then wanted to do Nexplanon, also changed her mind. Her sisters reportedly had bad experiences with these.   Patient previously got depo shots but felt like she gained weight with this. Interested in OCPs today. She does not have a history of migraine with aura or DVT. Denies tobacco use. Reports she will be able to take it reliably at the same time every day.  She is sexually active. LMP 2 weeks ago.  PERTINENT  PMH / PSH: obesity, asthma, eczema  OBJECTIVE:   BP 108/78   Pulse 78   Wt 261 lb 6.4 oz (118.6 kg)   SpO2 99%   BMI 52.80 kg/m   General: NAD, pleasant, able to participate in exam Respiratory: No respiratory distress Skin: warm and dry, no rashes noted Psych: Normal affect and mood Neuro: grossly intact  ASSESSMENT/PLAN:   Contraceptive Management Patient interested in OCPs for contraception. No contraindication to OCP use. LMP 2 weeks ago. Discussed pros/cons of various contraceptive options, including OCPs. Emphasized importance of adherence and that efficacy depends on proper use. Discussed it does not protect against STDs. Rx sent for Sprintec.    Maury Dus, MD [facility]

## 2021-03-16 NOTE — Patient Instructions (Signed)
It was great to see you!  Today we prescribed oral contraceptive pills (aka birth control) -You can start taking this immediately -The most important part is taking it at the same time EVERY DAY -Remember this does not protect you against sexually transmitted infections so it's a good idea to also use barrier protection (ie condoms) -If you experience any adverse effects, please let us know  Take care and seek immediate care sooner if you develop any concerns.  Dr. [facility]

## 2021-03-20 ENCOUNTER — Other Ambulatory Visit: Payer: Self-pay

## 2021-03-20 ENCOUNTER — Ambulatory Visit (INDEPENDENT_AMBULATORY_CARE_PROVIDER_SITE_OTHER): Payer: Medicaid Other | Admitting: *Deleted

## 2021-03-20 DIAGNOSIS — Z23 Encounter for immunization: Secondary | ICD-10-CM | POA: Diagnosis present

## 2021-05-03 ENCOUNTER — Other Ambulatory Visit: Payer: Self-pay | Admitting: Family Medicine

## 2021-05-03 DIAGNOSIS — L209 Atopic dermatitis, unspecified: Secondary | ICD-10-CM

## 2021-05-03 DIAGNOSIS — H608X3 Other otitis externa, bilateral: Secondary | ICD-10-CM

## 2021-05-03 MED ORDER — TRIAMCINOLONE ACETONIDE 0.1 % EX CREA: 1.0000 "application " | TOPICAL_CREAM | Freq: Two times a day (BID) | CUTANEOUS | 0 refills | Status: DC

## 2021-05-03 MED ORDER — HYDROCORTISONE-ACETIC ACID 1-2 % OT SOLN: 4.0000 [drp] | Freq: Four times a day (QID) | OTIC | 0 refills | Status: DC

## 2021-05-29 NOTE — Patient Instructions (Signed)
Thank you for coming to see me today. It was a pleasure.   Use Flonase two sprays to each nostril once a day  Please schedule an appointment for your initial PAP test  Please follow-up with PCP if symptoms do not improve or worsen  If you have any questions or concerns, please do not hesitate to call the office at [phone].  Best,   Dana Allan, MD    Otitis Media With Effusion, Adult Otitis media with effusion (OME) is inflammation and fluid (effusion) in the middle ear without having an ear infection. The middle ear is the space behind the eardrum. The middle ear is connected to the back of the throat by a narrow tube (eustachian tube). Normally the eustachian tube drains fluid out of the middle ear. A swollen eustachian tube can become blocked and cause fluid to collect in the middle ear. OME often goes away without treatment. Sometimes OME can lead to hearing problems and recurrent acute ear infections (acute otitis media). These conditions may require treatment. What are the causes? OME is caused by a blocked eustachian tube. This can result from: Allergies. Upper respiratory infections. Enlarged adenoids. The adenoids are areas of soft tissue located high in the back of the throat, behind the nose and the roof of the mouth. They are part of the body's natural defense system (immune system). Rapid changes in pressure, like when an airplane is descending or during scuba diving. In some cases, the cause of this condition is not known. What are the signs or symptoms? Common symptoms of this condition include: A feeling of fullness in your ear. Decreased hearing in the affected ear. Fluid draining into the ear canal. Pain in the ear. In some cases, there are no symptoms. How is this diagnosed? A health care provider can diagnose OME based on signs and symptoms of the condition. Your provider will also do a physical exam to check for fluid behind the eardrum. During the exam,  your health care provider will use an instrument called an otoscope to look in your ear. Your health care provider may do other tests, such as: A hearing test. A tympanogram. This is a test that shows how well the eardrum moves in response to air pressure in the ear canal. It provides a graph for your health care provider to review. A pneumatic otoscopy. This is a test to check how your eardrum moves in response to changes in pressure. It is done by squeezing a small amount of air into the ear. How is this treated? Treatment for OME depends on the cause of the condition and the severity of symptoms. The first step is often waiting to see if the fluid drains on its own in a few weeks. Home care treatment may include: Over-the-counter pain relievers. A warm, moist cloth placed over the ear. Severe cases may require a procedure to insert tubes in the ears (tympanostomy tubes) to drain the fluid. Follow these instructions at home: Take over-the-counter and prescription medicines only as told by your health care provider. Keep all follow-up visits. Contact a health care provider if: You have pain that gets worse. Hearing in your affected ear gets worse. You have fluid draining from your ear canal. You have dizziness. You develop a fever. Get help right away if: You develop a severe headache. You completely lose hearing in the affected ear. You have bleeding from your ear canal. You have sudden and severe pain in your ear. These symptoms may represent  a serious problem that is an emergency. Do not wait to see if the symptoms will go away. Get medical help right away. Call your local emergency services (911 in the [place] Do not drive yourself to the hospital. Summary Otitis media with effusion (OME) is inflammation and fluid (effusion) in the middle ear without having an ear infection. A swollen eustachian tube can become blocked and cause fluid to collect in the middle ear. Treatment for OME  depends on the cause of the condition and the severity of symptoms. Many times, treatment is not needed because the fluid drains on its own in a few weeks. Sometimes OME can lead to hearing problems and recurrent acute ear infections (acute otitis media), which may require treatment. This information is not intended to replace advice given to you by your health care provider. Make sure you discuss any questions you have with your health care provider. Document Revised: 10/26/2020 Document Reviewed: 10/26/2020 Elsevier Patient Education  2022 [employer].

## 2021-05-29 NOTE — Progress Notes (Signed)
    SUBJECTIVE:   CHIEF COMPLAINT / HPI: ear pain  Reports ear itching for 1-2 weeks.  Initially itching now painful in both ears, left greater than right.  Denies any drainage or discharge, fevers, dizziness.  Has has had some ringing in left ear and sounds more muffled.  Intermittent headaches without weakness, numbness or tingling and no slurred speech.  Took some Ibuprofen with little relief.  PERTINENT  PMH / PSH:  Asthma IBS Keloid of Right Pinna OBJECTIVE:   BP 112/63   Pulse 72   Ht 4\' 11"  (1.499 m)   Wt 242 lb 4 oz (109.9 kg)   LMP 04/29/2021   SpO2 98%   BMI 48.93 kg/m    General: Alert, no acute distress HEENT: Bilateral TM's visible and canals with mild erythema.  Serous fluid noted posterior to membranes bilaterally Lt>Rt,  No tenderness on palpation of tragus Neuro: Cranial nerves grossly intact   ASSESSMENT/PLAN:   Bilateral serous otitis media Flonase 2 sprays daily Continue Zyrtec daily Follow up with PCP as needed or if symptoms do not improve   05/01/2021, MD [facility]

## 2021-05-30 ENCOUNTER — Encounter: Payer: Self-pay | Admitting: Family Medicine

## 2021-05-30 ENCOUNTER — Ambulatory Visit (INDEPENDENT_AMBULATORY_CARE_PROVIDER_SITE_OTHER): Payer: Medicaid Other | Admitting: Family Medicine

## 2021-05-30 ENCOUNTER — Other Ambulatory Visit: Payer: Self-pay

## 2021-05-30 DIAGNOSIS — H6503 Acute serous otitis media, bilateral: Secondary | ICD-10-CM

## 2021-05-30 MED ORDER — FLUTICASONE PROPIONATE 50 MCG/ACT NA SUSP: 2.0000 | Freq: Every day | NASAL | 0 refills | Status: DC

## 2021-06-01 ENCOUNTER — Ambulatory Visit: Payer: Medicaid Other

## 2021-06-02 ENCOUNTER — Encounter: Payer: Self-pay | Admitting: Family Medicine

## 2021-06-02 DIAGNOSIS — H6593 Unspecified nonsuppurative otitis media, bilateral: Secondary | ICD-10-CM | POA: Insufficient documentation

## 2021-06-02 NOTE — Assessment & Plan Note (Signed)
Flonase 2 sprays daily Continue Zyrtec daily Follow up with PCP as needed or if symptoms do not improve

## 2021-06-28 ENCOUNTER — Encounter: Payer: Self-pay | Admitting: Family Medicine

## 2021-06-28 ENCOUNTER — Other Ambulatory Visit: Payer: Self-pay

## 2021-06-28 ENCOUNTER — Ambulatory Visit (INDEPENDENT_AMBULATORY_CARE_PROVIDER_SITE_OTHER): Payer: Medicaid Other | Admitting: Family Medicine

## 2021-06-28 VITALS — BP 108/78 | HR 96 | Ht 59.0 in | Wt 268.0 lb

## 2021-06-28 DIAGNOSIS — Z3A16 16 weeks gestation of pregnancy: Secondary | ICD-10-CM | POA: Diagnosis not present

## 2021-06-28 DIAGNOSIS — R350 Frequency of micturition: Secondary | ICD-10-CM | POA: Diagnosis not present

## 2021-06-28 LAB — POCT URINALYSIS DIP (MANUAL ENTRY)
Bilirubin, UA: NEGATIVE
Glucose, UA: NEGATIVE mg/dL
Ketones, POC UA: NEGATIVE mg/dL
Nitrite, UA: NEGATIVE
Spec Grav, UA: 1.02 (ref 1.010–1.025)
Urobilinogen, UA: 0.2 E.U./dL
pH, UA: 8 (ref 5.0–8.0)

## 2021-06-28 LAB — POCT URINE PREGNANCY: Preg Test, Ur: POSITIVE — AB

## 2021-06-28 MED ORDER — CEPHALEXIN 500 MG PO CAPS: 500.0000 mg | ORAL_CAPSULE | Freq: Four times a day (QID) | ORAL | 0 refills | Status: AC

## 2021-06-28 NOTE — Progress Notes (Signed)
° °  SUBJECTIVE:   CHIEF COMPLAINT / HPI:   Chief Complaint  Patient presents with   Possible UTI   Possible Pregnancy      Alice Gibson is a 20 y.o. [phone] female here for positive home pregnancy test about a month ago. [facility]. LMP unknown. States she was told she was 12 weeks on 11/8. She has a 7 month old son. She was taking ortho-cyclen but stopped maybe around August. She is excited to be pregnant again. She notes she has been having urinary frequency and dysuria for the past few days. No vaginal bleeding or discharge.     PERTINENT  PMH / PSH: reviewed and updated as appropriate   OBJECTIVE:   BP 108/78    Pulse 96    Ht 4\' 11"  (1.499 m)    Wt 268 lb (121.6 kg)    LMP  (LMP Unknown) Comment: stopped b/c about 1 month ago   SpO2 99%    BMI 54.13 kg/m    GEN: well appearing female in no acute distress  CVS: well perfused  RESP: speaking in full sentences without pause, no respiratory distress    ASSESSMENT/PLAN:    Pregnancy  Patient is a who had a preliminary [facility] on [facility]. She reports being told she was about 12 weeks which would put her about [redacted]w[redacted]d. This is a short interval pregnancy as she delivered in July 2022. Initial prenatal labs ordered as future as the lab closed for the day.  - dating ultrasound ordered >14 weeks  - take prenatals    UTI  Urinalysis consistent with cystitis. OB culture pending. Treat with Keflex for 5 days.   [redacted]w[redacted]d dating ultrasound   [redacted]w[redacted]d, DO PGY-3, [facility] 06/28/2021

## 2021-06-28 NOTE — Patient Instructions (Addendum)
We will need to schedule you for a dating ultrasound.  You will likely need an anatomy ultrasound in a few weeks. Please call to schedule you initial prenatal visit

## 2021-07-02 ENCOUNTER — Other Ambulatory Visit: Payer: Self-pay | Admitting: *Deleted

## 2021-07-02 DIAGNOSIS — Z3481 Encounter for supervision of other normal pregnancy, first trimester: Secondary | ICD-10-CM

## 2021-07-03 ENCOUNTER — Other Ambulatory Visit: Payer: Self-pay

## 2021-07-03 ENCOUNTER — Ambulatory Visit: Payer: Medicaid Other | Attending: Family Medicine

## 2021-07-03 ENCOUNTER — Other Ambulatory Visit: Payer: Self-pay | Admitting: *Deleted

## 2021-07-03 ENCOUNTER — Other Ambulatory Visit: Payer: Self-pay | Admitting: Family Medicine

## 2021-07-03 DIAGNOSIS — Z3689 Encounter for other specified antenatal screening: Secondary | ICD-10-CM | POA: Diagnosis not present

## 2021-07-03 DIAGNOSIS — O99212 Obesity complicating pregnancy, second trimester: Secondary | ICD-10-CM

## 2021-07-03 DIAGNOSIS — E669 Obesity, unspecified: Secondary | ICD-10-CM | POA: Diagnosis not present

## 2021-07-03 DIAGNOSIS — Z3A18 18 weeks gestation of pregnancy: Secondary | ICD-10-CM | POA: Diagnosis not present

## 2021-07-03 DIAGNOSIS — Z3A16 16 weeks gestation of pregnancy: Secondary | ICD-10-CM

## 2021-07-03 LAB — URINE CULTURE, OB REFLEX

## 2021-07-03 LAB — CULTURE, OB URINE

## 2021-07-03 NOTE — Addendum Note (Signed)
Addended by: Lamonte Sakai, Treyana Sturgell D on: 07/03/2021 11:13 AM   Modules accepted: Orders

## 2021-07-03 NOTE — Addendum Note (Signed)
Addended by: Lamonte Sakai, Virdell Hoiland D on: 07/03/2021 11:19 AM   Modules accepted: Orders

## 2021-07-06 ENCOUNTER — Other Ambulatory Visit: Payer: Medicaid Other

## 2021-07-11 ENCOUNTER — Encounter: Payer: Medicaid Other | Admitting: Family Medicine

## 2021-07-13 ENCOUNTER — Encounter: Payer: Medicaid Other | Admitting: Family Medicine

## 2021-07-13 NOTE — Progress Notes (Deleted)
Patient Name: Alice Gibson Date of Birth: November 11, 2000 [facility] Initial Prenatal Visit  Alice Gibson is a 20 y.o. year old [phone] at [redacted]w[redacted]d who presents for her initial prenatal visit. Pregnancy {Is/is not:9024} planned She reports {pregnancy symptoms:18128}. She {is/is not:320031::"is"} taking a prenatal vitamin.  She denies pelvic pain or vaginal bleeding.   Pregnancy Dating: The patient is dated by [place].  LMP: unknown Period is certain:  No.  Periods were regular:  N/A.  LMP was a typical period:  N/A.  Using hormonal contraception in 3 months prior to conception: Yes  Lab Review: Blood type: A POS Rh Status: {Rh status :23298::"+"} Antibody screen: {NEGATIVE/POSITIVE FOR:19998::"Negative"} HIV: {NEGATIVE/POSITIVE FOR:19998::"Negative"} RPR: {NEGATIVE/APPRO/POSITIVE FOR:20006::"Negative"} Hemoglobin electrophoresis reviewed: {yes/no:20286::"Yes"} Results of OB urine culture are: {NEGATIVE/POSITIVE FOR:19998::"Negative"} Rubella: {Desc; immune/not/unknown:31571::"Immune"} Hep C Ab: {NEGATIVE/POSITIVE FOR:19998::"Negative"} Varicella status is {Desc; immune/not/unknown:31571::"Immune"}  PMH: Reviewed and as detailed below: HTN: {yes/no:20286::"No"}  Gestational Hypertension/preeclampsia: {yes/no:20286::"No"}  Type 1 or 2 Diabetes: {yes/no:20286::"No"}  Depression:  {yes/no:20286::"No"}  Seizure disorder:  {yes/no:20286::"No"} VTE: {yes/no:20286::"No"} ,  History of STI {yes/no:20286::"No"},  Abnormal Pap smear:  {yes/no:20286::"No"}, Genital herpes simplex:  {yes/no:20286::"No"}   PSH: Gynecologic Surgery:  {No/  **:31982:o:"no"} Surgical history reviewed, notable for: ***  Obstetric History: Obstetric history tab updated and reviewed.  Summary of prior pregnancies: *** Cesarean delivery: {yes/no:20286::"No"}  Gestational Diabetes:  {yes/no:20286::"No"} Hypertension in pregnancy: {yes/no:20286::"No"} History of preterm birth:  {yes/no:20286::"No"} History of LGA/SGA infant:  {yes/no:20286::"No"} History of shoulder dystocia: {yes/no:20286::"No"} Indications for referral were reviewed, and the patient has no obstetric indications for referral to High Risk [facility] Clinic at this time.   Social History: Partner's name: ***  Tobacco use: {yes/no:20286::"No"} Alcohol use:  {yes/no:20286::"No"} Other substance use:  {yes/no:20286::"No"}  Current Medications:  ***  Reviewed and appropriate in pregnancy.   Genetic and Infection Screen: Flow Sheet Updated {yes/no:20286::"Yes"}  Prenatal Exam: Gen: Well nourished, well developed.  No distress.  Vitals noted. HEENT: Normocephalic, atraumatic.  Neck supple without cervical lymphadenopathy, thyromegaly or thyroid nodules.  Fair dentition. CV: RRR no murmur, gallops or rubs Lungs: CTA B.  Normal respiratory effort without wheezes or rales. Abd: soft, NTND. +BS.  Uterus not appreciated above pelvis. GU: Normal external female genitalia without lesions.  Nl vaginal, well rugated without lesions. No vaginal discharge.  Bimanual exam: No adnexal mass or TTP. No CMT.  Uterus size *** Ext: No clubbing, cyanosis or edema. Psych: Normal grooming and dress.  Not depressed or anxious appearing.  Normal thought content and process without flight of ideas or looseness of associations  Fetal heart tones: {appropriate:23337::"Appropriate"}  Assessment/Plan:  Alice Gibson is a 20 y.o. [phone] at [redacted]w[redacted]d who presents to initiate prenatal care. She is doing well.  Current pregnancy issues include ***.  Routine prenatal care: As dating {ACTION; IS/IS NOT:21021397::"is not"} reliable, a dating ultrasound {HAS HAS NOT:18834::"has"} been ordered. Dating tab updated. Pre-pregnancy weight updated. Expected weight gain this pregnancy is {weight gain pregnancy :23296::"25-35 pounds "} Prenatal labs reviewed, notable for ***. Indications for referral to [facility] were reviewed and the patient {DOES  NOT does:27190::"does not"} meet criteria for referral.  Medication list reviewed and updated.  Recommended patient see a dentist for regular care.  Bleeding and pain precautions reviewed. Importance of prenatal vitamins reviewed.  Genetic screening offered. Patient opted for: {obgeneticscreen:23414}. The patient has the following indications for aspirinto begin 81 mg at 12-16 weeks: One high risk condition: {fmcaspirinobhigh:26167} MORE than one moderate risk condition: {fmcaspirinobmoderate:26168} Aspirin {WAS/WAS NOT:[id number]::"was not"}  recommended today based upon above risk factors (one high risk condition or more than one moderate risk factor)  The patient {will/will not be:23415} age 68 or over at time of delivery. Referral to genetic counseling {WAS/WAS NOT:[phone]::"was not"} offered today.  The patient has the following risk factors for preexisting diabetes: {Pre-existing diabetes screening:23343::"Reviewed indications for early 1 hour glucose testing, not indicated "}. An early 1 hour glucose tolerance test {WAS/WAS NOT:[phone]::"was not"} ordered. Pregnancy Medical Home and PHQ-9 forms completed, problems noted: {yes/no:20286}  2. Pregnancy issues include the following which were addressed today:  ***   Follow up 4 weeks for next prenatal visit.

## 2021-07-15 NOTE — L&D Delivery Note (Signed)
OB/GYN Faculty Practice Delivery Note ? ?Alice Gibson is a 21 y.o. [phone] s/p SVD at [redacted]w[redacted]d. She was admitted for spontaneous onset of labor.  ? ?ROM: 0h 60m with light meconium fluid ?GBS Status: positive in urine  ?Maximum Maternal Temperature: 98.2 ? ?Labor Progress: ?Presented with contractions and found to be 4-5cm, progressed  to 7 cm and then [facility] and precipitously progressed to complete ? ?Delivery Date/Time: 1215 on 5/14 ?Delivery: Present at bedside during SROM and patient progressed to complete. Head delivered LOA. No nuchal cord present. Shoulder and body delivered in usual fashion. Infant with spontaneous cry, placed on mother's abdomen, dried and stimulated. Cord clamped x 2 after 1-minute delay, and cut by father of baby under my direct supervision. Cord blood drawn. Placenta delivered spontaneously with gentle cord traction. Fundus firm with massage and Pitocin. Labia, perineum, vagina, and cervix inspected and found to have no lacerations. ?  ?Given her multiparous status discussed recommendation for manual lower segment sweep. Patient at this time defers. ? ?Placenta: intact, 3V cord, to L&d ?Complications: none  ?Lacerations: none ?EBL: 300cc ?Analgesia: none ? ?Infant: female  APGARs 9,9  weight pending ? ?Warner Mccreedy, MD, MPH ?OB Fellow, Faculty Practice ?[facility] for [employer], [facility] ? ?

## 2021-07-22 NOTE — Progress Notes (Addendum)
Patient Name: Alice Gibson Date of Birth: 2000/12/24 [facility] Initial Prenatal Visit  Avalin MLISSA Gibson is a 21 y.o. year old [phone] at [redacted]w[redacted]d who presents for her initial prenatal visit. Dated by [redacted]w[redacted]d U/S.  Pregnancy is not planned but "more than welcomed" She reports nausea and vomiting (2-3 times in the morning, and one time in the afternoon typically). Also experiencing headaches. Headaches are "in between eyes and go out and on top". Takes Tylenol, which helps sometimes.  She is taking a prenatal vitamin.  She denies pelvic pain or vaginal bleeding.   Pregnancy Dating: The patient is dated by [redacted]w[redacted]d U/S.  LMP: 02/26/21 Period is certain:  Yes.  Periods were regular:  No.  LMP was a typical period:  Yes.  Using hormonal contraception in 3 months prior to conception: Yes  Lab Review: Blood type: A POS Rh Status: + Antibody screen: pending HIV: Previously negative but not yet done this pregnancy  RPR: Previously negative but not yet completed this pregnancy Hemoglobin electrophoresis reviewed: Yes Results of OB urine culture are: GBS bacteriuria  Rubella: Immune Hep C Ab: Previously negative but not yet done this pregnancy Varicella status is Immune  PMH: Reviewed and as detailed below: HTN: No  Gestational Hypertension/preeclampsia: No  Type 1 or 2 Diabetes: No  Depression:  No  Seizure disorder:  Yes VTE: No ,  History of STI No,  Abnormal Pap smear:  No, has never had Pap smear Genital herpes simplex:  No   PSH: Gynecologic Surgery:  no Surgical history reviewed, notable for: wisdom teeth removal  Obstetric History: Obstetric history tab updated and reviewed.  Summary of prior pregnancies:  First pregnancy: Delivered 07/10/2017 at unknown gestation (did not seek prenatal care). Delivered a healthy 6 lb 10.9 oz girl named Alice Gibson. Second pregnancy: Miscarriage 02/13/2020 Third pregnancy: Delivered 01/05/2021 via induction for IUGR at [redacted]w[redacted]d. Delivered  a healthy 6 lb 1 oz baby boy named Alice Gibson.  Cesarean delivery: No  Gestational Diabetes:  No Hypertension in pregnancy: No History of preterm birth: No History of LGA/SGA infant:  Yes, SGA History of shoulder dystocia: No Indications for referral were reviewed, and the patient has no obstetric indications for referral to High Risk OB Clinic at this time.   Social History: Partner's name: Alice Gibson   Tobacco use: No Alcohol use:  No Other substance use:  Yes previously smoked marijuana but not during pregnancy  Current Medications:  Albuterol inhaler, prenatals  Reviewed and appropriate in pregnancy.   Genetic and Infection Screen: Flow Sheet Updated Yes  Prenatal Exam: Gen: Well nourished, well developed.  No distress.  Vitals noted. HEENT: Normocephalic, atraumatic.  Neck supple without cervical lymphadenopathy, thyromegaly or thyroid nodules.  Fair dentition. CV: RRR no murmur, gallops or rubs Lungs: CTA B.  Normal respiratory effort without wheezes or rales. Abd: soft, NTND. +BS.  Uterus not appreciated above pelvis. GU: Patient declined GU exam today. Ext: No clubbing, cyanosis or edema. Psych: Normal grooming and dress.  Not depressed or anxious appearing.  Normal thought content and process without flight of ideas or looseness of associations  Fetal heart tones: Appropriate  Assessment/Plan:  Alice Gibson is a 21 y.o. [phone] at [redacted]w[redacted]d who presents to initiate prenatal care. She is doing well.  Current pregnancy issues include .  Routine prenatal care: As dating is reliable, a dating ultrasound has not been ordered. Dating tab updated. Pre-pregnancy weight updated. Expected weight gain this pregnancy is 11-20 pounds  Prenatal labs attempted today but unsuccessful x3. Patient to return on 1/12 for labs.  Indications for referral to HROB were reviewed and the patient does not meet criteria for referral.  Medication list reviewed and updated.  Recommended patient  see a dentist for regular care.  Bleeding and pain precautions reviewed. Importance of prenatal vitamins reviewed.  Genetic screening offered. Patient opted for: cell-free DNA screening The patient has the following indications for aspirinto begin 81 mg at 12-16 weeks: One high risk condition: no single high risk condition  MORE than one moderate risk condition: obesity and low SES   Aspirin was  recommended today based upon above risk factors (one high risk condition or more than one moderate risk factor)  The patient will not be age 17 or over at time of delivery. Referral to genetic counseling was not offered today.  The patient has the following risk factors for preexisting diabetes: BMI > 25 and high risk ethnicity (Latino, [place] American, Native American, [place] Islander, Asian Naval architect) . An early 1 hour glucose tolerance test was not ordered- given time constraints. Patient will return for lab only appointment on Thursday.  Pregnancy Medical Home and PHQ-9 forms completed, problems noted: Yes  2. Pregnancy issues include the following which were addressed today:  Asthma: uses inhaler PRN  Remote history of seizures: not on medications. Last seizure was at the age of 1. Short-interval pregnancy: last delivered June 2022.  History of IUGR/SGA and personal history obesity BMI 54. 81 mg ASA recommended today. Growth ultrasound with MFM 1/24 Nausea/vomiting: discussed conservative measures and return precautions. Diclegis sent to pharmacy.  Desires genetic screening. Counseled on next steps if positive and that this is a screening and not definitive. Paperwork completed today, however, patient was unable to get blood work today as she was dehydrated (had not had anything to eat or drink today as she was preparing for glucola).  She will return on 1/12 for prenatal labs and 1-hour glucola.   Follow up in 2 days for pre-natal labs and 1-hour glucola Following up with [facility] on 1/17 for Pap and  STI screening (patient refused collection today as she was "not prepared") Follow up 4 weeks for next prenatal visit with faculty.

## 2021-07-24 ENCOUNTER — Ambulatory Visit (INDEPENDENT_AMBULATORY_CARE_PROVIDER_SITE_OTHER): Payer: Medicaid Other

## 2021-07-24 ENCOUNTER — Ambulatory Visit (INDEPENDENT_AMBULATORY_CARE_PROVIDER_SITE_OTHER): Payer: Medicaid Other | Admitting: Family Medicine

## 2021-07-24 ENCOUNTER — Other Ambulatory Visit: Payer: Self-pay

## 2021-07-24 VITALS — BP 111/75 | HR 90 | Wt 268.0 lb

## 2021-07-24 DIAGNOSIS — Z8759 Personal history of other complications of pregnancy, childbirth and the puerperium: Secondary | ICD-10-CM | POA: Insufficient documentation

## 2021-07-24 DIAGNOSIS — Z3482 Encounter for supervision of other normal pregnancy, second trimester: Secondary | ICD-10-CM

## 2021-07-24 DIAGNOSIS — O09892 Supervision of other high risk pregnancies, second trimester: Secondary | ICD-10-CM | POA: Insufficient documentation

## 2021-07-24 DIAGNOSIS — Z23 Encounter for immunization: Secondary | ICD-10-CM | POA: Diagnosis present

## 2021-07-24 DIAGNOSIS — Z8669 Personal history of other diseases of the nervous system and sense organs: Secondary | ICD-10-CM | POA: Insufficient documentation

## 2021-07-24 DIAGNOSIS — Z3492 Encounter for supervision of normal pregnancy, unspecified, second trimester: Secondary | ICD-10-CM | POA: Insufficient documentation

## 2021-07-24 DIAGNOSIS — O219 Vomiting of pregnancy, unspecified: Secondary | ICD-10-CM

## 2021-07-24 HISTORY — DX: Personal history of other complications of pregnancy, childbirth and the puerperium: Z87.59

## 2021-07-24 MED ORDER — DOXYLAMINE-PYRIDOXINE 10-10 MG PO TBEC: 10.0000 mg | DELAYED_RELEASE_TABLET | Freq: Two times a day (BID) | ORAL | 1 refills | Status: DC

## 2021-07-24 NOTE — Patient Instructions (Addendum)
It was wonderful to see you today.  Please bring ALL of your medications with you to every visit.   Today we talked about:  -Take your prenatal vitamins daily. -It is safe to use Tylenol in pregnancy. Continue this for your headaches. Your headaches may be due to dehydration from your vomiting.  -If you have recurrent vomiting and are unable to keep down fluid, please go to the MAU shown below.   If you experience any vaginal bleeding, leakage of fluids, don't feel your baby moving as much, or start to have contractions less than 5 minutes apart please go directly to the Maternal Assessment Unit at [facility] for evaluation.  Maternity and women's care services located on the [place] side of [facility] [employer] [place]. [facility] (Entrance C off [address]  [address] Entrance [address] [place],  [place]  [address]      Thank you for choosing [facility].   Please call [phone] with any questions about today's appointment.  Please be sure to schedule follow up at the front  desk before you leave today.   Sabino Dick, DO PGY-2 Family Medicine

## 2021-07-26 ENCOUNTER — Other Ambulatory Visit: Payer: Medicaid Other

## 2021-07-26 ENCOUNTER — Ambulatory Visit: Payer: Medicaid Other

## 2021-07-26 NOTE — Progress Notes (Deleted)
[facility] on 1/17 for a Pap smear/bimanual/STI screening.  Also needs early 1-hour GTT today, order already in as future.

## 2021-08-07 ENCOUNTER — Encounter: Payer: Self-pay | Admitting: *Deleted

## 2021-08-07 ENCOUNTER — Ambulatory Visit: Payer: Medicaid Other | Admitting: *Deleted

## 2021-08-07 ENCOUNTER — Ambulatory Visit: Payer: Medicaid Other | Attending: Maternal & Fetal Medicine

## 2021-08-07 ENCOUNTER — Other Ambulatory Visit: Payer: Self-pay

## 2021-08-07 VITALS — BP 119/65 | HR 80

## 2021-08-07 DIAGNOSIS — Z6841 Body Mass Index (BMI) 40.0 and over, adult: Secondary | ICD-10-CM | POA: Diagnosis present

## 2021-08-07 DIAGNOSIS — Z3A23 23 weeks gestation of pregnancy: Secondary | ICD-10-CM

## 2021-08-07 DIAGNOSIS — O99212 Obesity complicating pregnancy, second trimester: Secondary | ICD-10-CM | POA: Diagnosis present

## 2021-08-07 DIAGNOSIS — O09292 Supervision of pregnancy with other poor reproductive or obstetric history, second trimester: Secondary | ICD-10-CM

## 2021-08-07 DIAGNOSIS — E668 Other obesity: Secondary | ICD-10-CM

## 2021-08-08 ENCOUNTER — Other Ambulatory Visit: Payer: Self-pay

## 2021-08-08 ENCOUNTER — Other Ambulatory Visit: Payer: Self-pay | Admitting: *Deleted

## 2021-08-08 ENCOUNTER — Encounter: Payer: Self-pay | Admitting: Family Medicine

## 2021-08-08 ENCOUNTER — Other Ambulatory Visit (HOSPITAL_COMMUNITY)
Admission: RE | Admit: 2021-08-08 | Discharge: 2021-08-08 | Disposition: A | Discharge status: Home / Self Care | Payer: Medicaid Other | Source: Ambulatory Visit | Attending: Family Medicine | Admitting: Family Medicine

## 2021-08-08 ENCOUNTER — Ambulatory Visit (INDEPENDENT_AMBULATORY_CARE_PROVIDER_SITE_OTHER): Payer: Medicaid Other | Admitting: Family Medicine

## 2021-08-08 VITALS — BP 113/67 | HR 96 | Wt 267.0 lb

## 2021-08-08 DIAGNOSIS — Z3689 Encounter for other specified antenatal screening: Secondary | ICD-10-CM

## 2021-08-08 DIAGNOSIS — Z3482 Encounter for supervision of other normal pregnancy, second trimester: Secondary | ICD-10-CM | POA: Insufficient documentation

## 2021-08-08 DIAGNOSIS — Z113 Encounter for screening for infections with a predominantly sexual mode of transmission: Secondary | ICD-10-CM | POA: Insufficient documentation

## 2021-08-08 DIAGNOSIS — Z124 Encounter for screening for malignant neoplasm of cervix: Secondary | ICD-10-CM

## 2021-08-08 DIAGNOSIS — O09299 Supervision of pregnancy with other poor reproductive or obstetric history, unspecified trimester: Secondary | ICD-10-CM

## 2021-08-08 DIAGNOSIS — Z6841 Body Mass Index (BMI) 40.0 and over, adult: Secondary | ICD-10-CM

## 2021-08-08 LAB — OB RESULTS CONSOLE GC/CHLAMYDIA: Gonorrhea: NEGATIVE

## 2021-08-08 NOTE — Progress Notes (Signed)
°  [facility] Prenatal Visit  Alice Gibson is a 21 y.o. [phone] at [redacted]w[redacted]d here for routine follow up. She is dated by [redacted]w[redacted]d [place].  She reports nausea and vomiting.  She reports good fetal movement. No bleeding, loss of fluid, contractions. See flow sheet for details. Vitals:   08/08/21 0852  BP: 113/67  Pulse: 96     A/P: Pregnancy at [redacted]w[redacted]d.  Doing well.   Dating reviewed, dating tab is correct Fetal heart tones Appropriate at 145bpm Fundal height within expected range.  Anatomy ultrasound reviewed and notable for female gender.  Influenza vaccine previously administered. Marland Kitchen  COVID vaccination was discussed and previously administered.  Indications for screening for preexisting diabetes include: BMI > 25 and sedentary lifestyle.  Pregnancy education provided on the following topics: fetal growth and movement, ultrasound assessment, and upcoming laboratory assessment.   The patient has the following indications for aspirinto begin 81 mg at 12-16 weeks: One high risk condition: no single high risk condition  MORE than one moderate risk condition: obesity and low SES   Aspirin was  recommended today based upon above risk factors (one high risk condition or more than one moderate risk factor)  Scheduled for [facility] during second trimester on 08/23/2021. Preterm labor precautions given.   2. Pregnancy issues include the following and were addressed as appropriate today:   Pap smear and Gonorrhea/Chlamydia testing performed today  Glucola testing completed, awaiting lab results  Labs for initial pre-natal visit were collected today  ASA recommended to patient   Problem list and pregnancy box updated: Yes.     Follow up 4 weeks.

## 2021-08-08 NOTE — Patient Instructions (Signed)
Today we are drawing your labs and testing your glucose, if it abnormal we will let you know and follow-up testing. Your baby is looking great right now and I do not think you are dehydrated at this time. I recommend starting a baby dose of aspirin (81mg ) daily to help prevent pre-eclampsia.  Your pap smear and STD testing were also completed today and I will follow-up with you regarding the results.     Second Trimester of Pregnancy The second trimester of pregnancy is from week 13 through week 27. This is months 4 through 6 of pregnancy. The second trimester is often a time when you feel your best. Your body has adjusted to being pregnant, and you begin to feel better physically. During the second trimester: Morning sickness has lessened or stopped completely. You may have more energy. You may have an increase in appetite. The second trimester is also a time when the unborn baby (fetus) is growing rapidly. At the end of the sixth month, the fetus may be up to 12 inches long and weigh about 1 pounds. You will likely begin to feel the baby move (quickening) between 16 and 20 weeks of pregnancy. Body changes during your second trimester Your body continues to go through many changes during your second trimester. The changes vary and generally return to normal after the baby is born. Physical changes Your weight will continue to increase. You will notice your lower abdomen bulging out. You may begin to get stretch marks on your hips, abdomen, and breasts. Your breasts will continue to grow and to become tender. Dark spots or blotches (chloasma or mask of pregnancy) may develop on your face. A dark line from your belly button to the pubic area (linea nigra) may appear. You may have changes in your hair. These can include thickening of your hair, rapid growth, and changes in texture. Some people also have hair loss during or after pregnancy, or hair that feels dry or thin. Health changes You may  develop headaches. You may have heartburn. You may develop constipation. You may develop hemorrhoids or swollen, bulging veins (varicose veins). Your gums may bleed and may be sensitive to brushing and flossing. You may urinate more often because the fetus is pressing on your bladder. You may have back pain. This is caused by: Weight gain. Pregnancy hormones that are relaxing the joints in your pelvis. A shift in weight and the muscles that support your balance. Follow these instructions at home: Medicines Follow your health care provider's instructions regarding medicine use. Specific medicines may be either safe or unsafe to take during pregnancy. Do not take any medicines unless approved by your health care provider. Take a prenatal vitamin that contains at least 600 micrograms (mcg) of folic acid. Eating and drinking Eat a healthy diet that includes fresh fruits and vegetables, whole grains, good sources of protein such as meat, eggs, or tofu, and low-fat dairy products. Avoid raw meat and unpasteurized juice, milk, and cheese. These carry germs that can harm you and your baby. You may need to take these actions to prevent or treat constipation: Drink enough fluid to keep your urine pale yellow. Eat foods that are high in fiber, such as beans, whole grains, and fresh fruits and vegetables. Limit foods that are high in fat and processed sugars, such as fried or sweet foods. Activity Exercise only as directed by your health care provider. Most people can continue their usual exercise routine during pregnancy. Try to exercise for  30 minutes at least 5 days a week. Stop exercising if you develop contractions in your uterus. Stop exercising if you develop pain or cramping in the lower abdomen or lower back. Avoid exercising if it is very hot or humid or if you are at a high altitude. Avoid heavy lifting. If you choose to, you may have sex unless your health care provider tells you not  to. Relieving pain and discomfort Wear a supportive bra to prevent discomfort from breast tenderness. Take warm sitz baths to soothe any pain or discomfort caused by hemorrhoids. Use hemorrhoid cream if your health care provider approves. Rest with your legs raised (elevated) if you have leg cramps or low back pain. If you develop varicose veins: Wear support hose as told by your health care provider. Elevate your feet for 15 minutes, 3-4 times a day. Limit salt in your diet. Safety Wear your seat belt at all times when driving or riding in a car. Talk with your health care provider if someone is verbally or physically abusive to you. Lifestyle Do not use hot tubs, steam rooms, or saunas. Do not douche. Do not use tampons or scented sanitary pads. Avoid cat litter boxes and soil used by cats. These carry germs that can cause birth defects in the baby and possibly loss of the fetus by miscarriage or stillbirth. Do not use herbal remedies, alcohol, illegal drugs, or medicines that are not approved by your health care provider. Chemicals in these products can harm your baby. Do not use any products that contain nicotine or tobacco, such as cigarettes, e-cigarettes, and chewing tobacco. If you need help quitting, ask your health care provider. General instructions During a routine prenatal visit, your health care provider will do a physical exam and other tests. He or she will also discuss your overall health. Keep all follow-up visits. This is important. Ask your health care provider for a referral to a local prenatal education class. Ask for help if you have counseling or nutritional needs during pregnancy. Your health care provider can offer advice or refer you to specialists for help with various needs. Where to find more information American Pregnancy Association: americanpregnancy.org [employer] of Obstetricians and Gynecologists: https://www.todd-brady.net/ Office on  [employer] Health: MightyReward.co.nz Contact a health care provider if you have: A headache that does not go away when you take medicine. Vision changes or you see spots in front of your eyes. Mild pelvic cramps, pelvic pressure, or nagging pain in the abdominal area. Persistent nausea, vomiting, or diarrhea. A bad-smelling vaginal discharge or foul-smelling urine. Pain when you urinate. Sudden or extreme swelling of your face, hands, ankles, feet, or legs. A fever. Get help right away if you: Have fluid leaking from your vagina. Have spotting or bleeding from your vagina. Have severe abdominal cramping or pain. Have difficulty breathing. Have chest pain. Have fainting spells. Have not felt your baby move for the time period told by your health care provider. Have new or increased pain, swelling, or redness in an arm or leg. Summary The second trimester of pregnancy is from week 13 through week 27 (months 4 through 6). Do not use herbal remedies, alcohol, illegal drugs, or medicines that are not approved by your health care provider. Chemicals in these products can harm your baby. Exercise only as directed by your health care provider. Most people can continue their usual exercise routine during pregnancy. Keep all follow-up visits. This is important. This information is not intended to replace advice given  to you by your health care provider. Make sure you discuss any questions you have with your health care provider. Document Revised: 12/08/2019 Document Reviewed: 10/14/2019 Elsevier Patient Education  2022 [employer].

## 2021-08-09 LAB — CBC/D/PLT+RPR+RH+ABO+RUBIGG...
Antibody Screen: NEGATIVE
Basophils Absolute: 0.1 10*3/uL (ref 0.0–0.2)
Basos: 1 %
EOS (ABSOLUTE): 0.1 10*3/uL (ref 0.0–0.4)
Eos: 1 %
HCV Ab: <0.1 s/co ratio (ref 0.0–0.9)
HIV Screen 4th Generation wRfx: NONREACTIVE
Hematocrit: 37.8 % (ref 34.0–46.6)
Hemoglobin: 12.8 g/dL (ref 11.1–15.9)
Hepatitis B Surface Ag: NEGATIVE
Immature Grans (Abs): 0.1 10*3/uL (ref 0.0–0.1)
Immature Granulocytes: 1 %
Lymphocytes Absolute: 2.1 10*3/uL (ref 0.7–3.1)
Lymphs: 20 %
MCH: 29.8 pg (ref 26.6–33.0)
MCHC: 33.9 g/dL (ref 31.5–35.7)
MCV: 88 fL (ref 79–97)
Monocytes Absolute: 0.5 10*3/uL (ref 0.1–0.9)
Monocytes: 4 %
Neutrophils Absolute: 8 10*3/uL — ABNORMAL HIGH (ref 1.4–7.0)
Neutrophils: 73 %
Platelets: 298 10*3/uL (ref 150–450)
RBC: 4.3 x10E6/uL (ref 3.77–5.28)
RDW: 14.2 % (ref 11.7–15.4)
RPR Ser Ql: NONREACTIVE
Rh Factor: POSITIVE
Rubella Antibodies, IGG: 2.6 index (ref >0.99)
WBC: 10.7 10*3/uL (ref 3.4–10.8)

## 2021-08-09 LAB — HCV INTERPRETATION

## 2021-08-10 LAB — CYTOLOGY - PAP
Chlamydia: NEGATIVE
Comment: NEGATIVE
Comment: NORMAL
Diagnosis: NEGATIVE
Neisseria Gonorrhea: NEGATIVE

## 2021-08-13 LAB — GLUCOSE TOLERANCE, 1 HOUR: Glucose, 1Hr PP: 63 mg/dL — ABNORMAL LOW (ref 70–199)

## 2021-08-13 LAB — HGB FRACTIONATION CASCADE
Hgb A2: 2.6 % (ref 1.8–3.2)
Hgb A: 97.4 % (ref 96.4–98.8)
Hgb F: 0 % (ref 0.0–2.0)
Hgb S: 0 %

## 2021-08-20 DIAGNOSIS — R8271 Bacteriuria: Secondary | ICD-10-CM | POA: Insufficient documentation

## 2021-08-20 HISTORY — DX: Bacteriuria: R82.71

## 2021-08-23 ENCOUNTER — Other Ambulatory Visit: Payer: Self-pay

## 2021-08-23 ENCOUNTER — Ambulatory Visit (INDEPENDENT_AMBULATORY_CARE_PROVIDER_SITE_OTHER): Payer: Medicaid Other | Admitting: Family Medicine

## 2021-08-23 VITALS — BP 119/68 | HR 92 | Wt 267.4 lb

## 2021-08-23 DIAGNOSIS — G5600 Carpal tunnel syndrome, unspecified upper limb: Secondary | ICD-10-CM

## 2021-08-23 DIAGNOSIS — Z3492 Encounter for supervision of normal pregnancy, unspecified, second trimester: Secondary | ICD-10-CM

## 2021-08-23 DIAGNOSIS — R8271 Bacteriuria: Secondary | ICD-10-CM

## 2021-08-23 DIAGNOSIS — O26899 Other specified pregnancy related conditions, unspecified trimester: Secondary | ICD-10-CM

## 2021-08-23 NOTE — Progress Notes (Signed)
°  [facility] Prenatal Visit  Sandrea VALI CAPANO is a 21 y.o. [phone] at [redacted]w[redacted]d here for routine follow up. She is dated by [redacted]w[redacted]d ultrasound with EDD 11/29/21.  She reports carpal tunnel symptoms. She reports fetal movement. Denies vaginal bleeding, loss of fluid, or contractions.  See flow sheet for details.  Reports worsening wrist pain/carpal tunnel pain at night for 2 months. Tried taking tylenol and ibuprofen for relief. Has had this in prior pregnancies.   Also complains of numbness in bilateral feet with certain positions (sitting cross-legged). No pain, weakness, drop foot.  A/P: Pregnancy at [redacted]w[redacted]d.  Doing well.   Dating reviewed, dating tab is correct Fetal heart tones Appropriate146 bpm Fundal height within expected range.  Influenza vaccine previously administered.   COVID vaccination was discussed and administered on 07/24/2021.  ASA: Not taking. Discussed indication with patient at last visit and today. Screening for gestational diabetes Not completed , return in 2 weeks for 1 hr glucola. .  Pregnancy education completed including: fetal growth, breastfeeding, contraception, and expected weight gain in pregnancy.   The patient does not have a history of Cesarean delivery and no referral to [facility] is indicated Scheduled for [facility] during second trimester on today. Preterm labor, bleeding, and pain precautions given.    2. Pregnancy issues include the following and were addressed as appropriate today:   Carpal tunnel: Interferes with job at [employer]. Advised that this is common and typically worsened in pregnancy due to hypervolemia. Discussed treatment options with conservative measures including splinting vs. pursuing steroid injections which patient desires due to interference with sleep and severity of pain. Referral placed to sports medicine. Hx seizures: in childhood. Last seizure at 21 years of age. Does not take AEDs or follow with  neurology Mild intermittent asthma: Uses albuterol PRN- 0 uses in the past week. Exacerbations with illness and exercise.   Problem list and pregnancy box updated: Yes.   Follow up 4 weeks.

## 2021-08-23 NOTE — Patient Instructions (Addendum)
Don't forget your have your growth [place] scheduled on 09/11/21.   Please call [phone] to schedule an appointment with the Sports Medicine doctor for carpal tunnel.  Please make a follow up appt in 2 weeks. You will drink a sugary drink for a pregnancy related diabetes screening test and get bloodwork at your next visit.  Pregnancy Related Return Precautions The follow are signs/symptoms that are abnormal in pregnancy and may require further evaluation by a physician: Go to the MAU at [facility] at [facility] if: You have cramping/contractions that do not go away with drinking water, especially if they are lasting 30 seconds to 1.5 minutes, coming and going every 5-10 minutes for an hour or more, or are getting stronger and you cannot walk or talk while having a contraction/cramp. Your water breaks.  Sometimes it is a big gush of fluid, sometimes it is just a trickle that keeps getting your underwear wet or running down your legs You have vaginal bleeding.    You do not feel your baby moving like normal.  If you do not, get something to eat and drink (something cold or something with sugar like peanut butter or juice) and lay down and focus on feeling your baby move. If your baby is still not moving like normal, you should go to MAU. You should feel your baby move 6 times in one hour, or 10 times in two hours. You have a persistent headache that does not go away with 1 g of Tylenol, vision changes, chest pain, difficulty breathing, severe pain in your right upper abdomen, worsening leg swelling- these can all be signs of high blood pressure in pregnancy and need to be evaluated by a provider immediately  These are all concerning in pregnancy and if you have any of these I recommend you call your PCP and present to the Maternity Admissions Unit (map below) for further evaluation.  For any pregnancy-related emergencies, please go to the Maternity Admissions Unit in the Women's &  Children's Center at [facility]. You will use hospital Entrance C.

## 2021-08-28 ENCOUNTER — Ambulatory Visit (INDEPENDENT_AMBULATORY_CARE_PROVIDER_SITE_OTHER): Payer: Medicaid Other | Admitting: Sports Medicine

## 2021-08-28 VITALS — BP 94/68 | Ht 59.0 in | Wt 267.0 lb

## 2021-08-28 DIAGNOSIS — G5601 Carpal tunnel syndrome, right upper limb: Secondary | ICD-10-CM

## 2021-08-29 NOTE — Progress Notes (Signed)
° °  Subjective:    Patient ID: Alice Gibson, female    DOB: 22-Oct-2000, 22 y.o.   MRN: [id number]  HPI chief complaint: Right wrist and hand pain and numbness  Patient is a very pleasant 21 year old female that is approximately [redacted] weeks pregnant presenting today with pain and numbness in the right wrist and hand this been present for several weeks.  No injury that she can recall.  She describes a burning type pain that begins in the volar aspect of her wrist and will radiate into the palmar aspect of her right hand which she describes along the median nerve distribution.  Her symptoms are worse at night and first thing in the morning.  They do seem to improve throughout the day.  She is getting some mild symptoms in the left wrist as well.  She saw her primary care physician on February 9 and was placed into a wrist brace but it sounds like it was more of a compression brace than a true cock-up wrist brace.  Past medical history reviewed Medications reviewed Allergies reviewed    Review of Systems As above    Objective:   Physical Exam  Well-developed, well-nourished.  No acute distress  Right wrist: Full range of motion.  No effusion.  Positive Tinel's, positive Phalen's.  No atrophy.  Sensation is intact to light touch grossly.  Good pulses.      Assessment & Plan:   Carpal tunnel syndrome, right wrist  Patient has not had an adequate trial of nighttime splinting.  We will fit her today with a cock up wrist brace to wear at night for the next 3 weeks.  If symptoms persist thereafter then we could consider merits of a cortisone injection although true treatment for this is delivery of her child.  Follow-up for ongoing or recalcitrant issues.  This note was dictated using Dragon naturally speaking software and may contain errors in syntax, spelling, or content which have not been identified prior to signing this note.

## 2021-09-04 ENCOUNTER — Other Ambulatory Visit: Payer: Self-pay | Admitting: *Deleted

## 2021-09-04 DIAGNOSIS — Z3482 Encounter for supervision of other normal pregnancy, second trimester: Secondary | ICD-10-CM

## 2021-09-07 ENCOUNTER — Other Ambulatory Visit: Payer: Medicaid Other

## 2021-09-11 ENCOUNTER — Ambulatory Visit: Payer: Medicaid Other | Attending: Obstetrics and Gynecology

## 2021-09-11 ENCOUNTER — Ambulatory Visit: Payer: Medicaid Other | Admitting: *Deleted

## 2021-09-11 ENCOUNTER — Other Ambulatory Visit: Payer: Self-pay

## 2021-09-11 VITALS — BP 116/66 | HR 94

## 2021-09-11 DIAGNOSIS — O99213 Obesity complicating pregnancy, third trimester: Secondary | ICD-10-CM

## 2021-09-11 DIAGNOSIS — O09299 Supervision of pregnancy with other poor reproductive or obstetric history, unspecified trimester: Secondary | ICD-10-CM

## 2021-09-11 DIAGNOSIS — O09293 Supervision of pregnancy with other poor reproductive or obstetric history, third trimester: Secondary | ICD-10-CM | POA: Diagnosis not present

## 2021-09-11 DIAGNOSIS — Z3A28 28 weeks gestation of pregnancy: Secondary | ICD-10-CM

## 2021-09-11 DIAGNOSIS — Z6841 Body Mass Index (BMI) 40.0 and over, adult: Secondary | ICD-10-CM

## 2021-09-11 DIAGNOSIS — Z3689 Encounter for other specified antenatal screening: Secondary | ICD-10-CM | POA: Diagnosis present

## 2021-09-12 ENCOUNTER — Other Ambulatory Visit: Payer: Self-pay

## 2021-09-12 ENCOUNTER — Other Ambulatory Visit: Payer: Self-pay | Admitting: *Deleted

## 2021-09-12 ENCOUNTER — Other Ambulatory Visit (INDEPENDENT_AMBULATORY_CARE_PROVIDER_SITE_OTHER): Payer: Medicaid Other

## 2021-09-12 DIAGNOSIS — Z6841 Body Mass Index (BMI) 40.0 and over, adult: Secondary | ICD-10-CM

## 2021-09-12 DIAGNOSIS — Z8759 Personal history of other complications of pregnancy, childbirth and the puerperium: Secondary | ICD-10-CM

## 2021-09-12 DIAGNOSIS — Z3482 Encounter for supervision of other normal pregnancy, second trimester: Secondary | ICD-10-CM | POA: Diagnosis present

## 2021-09-12 LAB — POCT 1 HR PRENATAL GLUCOSE: Glucose 1 Hr Prenatal, POC: 147 mg/dL

## 2021-09-19 ENCOUNTER — Other Ambulatory Visit: Payer: Medicaid Other

## 2021-09-26 ENCOUNTER — Other Ambulatory Visit: Payer: Medicaid Other

## 2021-09-26 ENCOUNTER — Encounter: Payer: Self-pay | Admitting: Family Medicine

## 2021-10-05 ENCOUNTER — Other Ambulatory Visit: Payer: Medicaid Other

## 2021-10-10 ENCOUNTER — Other Ambulatory Visit: Payer: Self-pay | Admitting: Family Medicine

## 2021-10-10 ENCOUNTER — Other Ambulatory Visit (INDEPENDENT_AMBULATORY_CARE_PROVIDER_SITE_OTHER): Payer: Medicaid Other

## 2021-10-10 ENCOUNTER — Encounter: Payer: Self-pay | Admitting: Family Medicine

## 2021-10-10 ENCOUNTER — Ambulatory Visit: Payer: Medicaid Other | Attending: Obstetrics

## 2021-10-10 ENCOUNTER — Other Ambulatory Visit: Payer: Self-pay

## 2021-10-10 ENCOUNTER — Ambulatory Visit: Payer: Medicaid Other | Admitting: *Deleted

## 2021-10-10 VITALS — BP 119/71 | HR 84

## 2021-10-10 DIAGNOSIS — Z3A32 32 weeks gestation of pregnancy: Secondary | ICD-10-CM

## 2021-10-10 DIAGNOSIS — Z3482 Encounter for supervision of other normal pregnancy, second trimester: Secondary | ICD-10-CM

## 2021-10-10 DIAGNOSIS — O219 Vomiting of pregnancy, unspecified: Secondary | ICD-10-CM

## 2021-10-10 DIAGNOSIS — E669 Obesity, unspecified: Secondary | ICD-10-CM | POA: Diagnosis not present

## 2021-10-10 DIAGNOSIS — Z8759 Personal history of other complications of pregnancy, childbirth and the puerperium: Secondary | ICD-10-CM | POA: Diagnosis present

## 2021-10-10 DIAGNOSIS — O99213 Obesity complicating pregnancy, third trimester: Secondary | ICD-10-CM

## 2021-10-10 DIAGNOSIS — Z6841 Body Mass Index (BMI) 40.0 and over, adult: Secondary | ICD-10-CM | POA: Diagnosis not present

## 2021-10-10 DIAGNOSIS — O09293 Supervision of pregnancy with other poor reproductive or obstetric history, third trimester: Secondary | ICD-10-CM

## 2021-10-10 LAB — POCT CBG (FASTING - GLUCOSE)-MANUAL ENTRY: Glucose Fasting, POC: 86 mg/dL (ref 70–99)

## 2021-10-10 MED ORDER — ONDANSETRON 4 MG PO TBDP
4.0000 mg | ORAL_TABLET | Freq: Once | ORAL | Status: AC
  Administered 2021-10-10: 4 mg via ORAL

## 2021-10-11 ENCOUNTER — Other Ambulatory Visit: Payer: Self-pay | Admitting: *Deleted

## 2021-10-11 DIAGNOSIS — O365930 Maternal care for other known or suspected poor fetal growth, third trimester, not applicable or unspecified: Secondary | ICD-10-CM

## 2021-10-11 DIAGNOSIS — O99213 Obesity complicating pregnancy, third trimester: Secondary | ICD-10-CM

## 2021-10-11 LAB — CBC
Hematocrit: 35.8 % (ref 34.0–46.6)
Hemoglobin: 12 g/dL (ref 11.1–15.9)
MCH: 29.4 pg (ref 26.6–33.0)
MCHC: 33.5 g/dL (ref 31.5–35.7)
MCV: 88 fL (ref 79–97)
Platelets: 263 10*3/uL (ref 150–450)
RBC: 4.08 x10E6/uL (ref 3.77–5.28)
RDW: 13 % (ref 11.7–15.4)
WBC: 9.4 10*3/uL (ref 3.4–10.8)

## 2021-10-11 LAB — GESTATIONAL GLUCOSE TOLERANCE
Glucose, Fasting: 78 mg/dL (ref 70–94)
Glucose, GTT - 1 Hour: 130 mg/dL (ref 70–179)
Glucose, GTT - 2 Hour: 90 mg/dL (ref 70–154)
Glucose, GTT - 3 Hour: 83 mg/dL (ref 70–139)

## 2021-10-11 LAB — RPR: RPR Ser Ql: NONREACTIVE

## 2021-10-11 LAB — HIV ANTIBODY (ROUTINE TESTING W REFLEX): HIV Screen 4th Generation wRfx: NONREACTIVE

## 2021-10-11 NOTE — Progress Notes (Deleted)
?  [facility] Prenatal Visit ? ?Alice Gibson is a 21 y.o. [phone] at [redacted]w[redacted]d here for routine follow up. She is dated by midtrimester ultrasound.  She reports {symptoms; pregnancy related:14538}.  She reports fetal movement. She denies vaginal bleeding, contractions, or loss of fluid.  See flow sheet for details. ? ?There were no vitals filed for this visit. ? ? ?A/P: Pregnancy at [redacted]w[redacted]d.  Doing well.   ?Routine prenatal care:  ?Dating reviewed, dating tab is {correct:23336::"correct"} ?Fetal heart tones: {appropriate:23337} ?Fundal height: {fundal height:23342::"within expected range. "} ?The patient {does/does not:200015::"does not"} have a history of HSV and valacyclovir {ACTION; IS/IS NOT:21021397::"is not"} indicated at this time.  ?The patient {CWHCS:23409::"does not have a history of Cesarean delivery and no referral to [facility] is indicated"} ?Infant feeding choice: {Breasfeeding:23347::"Breastfeeding"} ?Contraception choice: {postpartum contraception:23348} ?Infant circumcision desired not applicable ?Influenza vaccine {given:23340}  ?Tdap {was/was not:19854::"was"} given today. ?COVID vaccination was discussed and ***.  ?Childbirth and education classes {WERE / WERE XFG:18299} offered. ?Pregnancy education regarding benefits of breastfeeding, contraception, fetal growth, expected weight gain, and safe infant sleep were discussed.  ?Preterm labor and fetal movement precautions reviewed. ? ? ?2. Pregnancy issues include the following and were addressed as appropriate today: ? Postpartum Plans: ? - delivery planning: SVD, IV pain meds, does not want epidural ?            - circumcision: n/a female ?            - feeding: both   ? - pediatrician: [facility] ? - contraception: Nexplanon ?     Genetic screening: cell free DNA ordered*** today ?     ASA: indicated due to obesity, SES, hx of IUGR ?     GBS bacteriuria: will need abx in labor ?     Third trimester labs reviewed: normal CBC,  NR HIV and RPR. GTT WNL.  ?     H/o asthma: uses albuterol inhaler PRN ?     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     Short interval pregnancy: last delivered June 2022 ?     Followed by MFM due to obesity: last [place] 10/10/21, reassuring, EFW 62%, normal anatomy. Scheduled for BPP 10/17/21 ? Problem list and pregnancy box updated: {yes/no:20286::"Yes"}.  ? ?Scheduled for [facility] in third trimester on ***.  ? ?Follow up 2 weeks. ? ?

## 2021-10-15 ENCOUNTER — Other Ambulatory Visit: Payer: Self-pay | Admitting: *Deleted

## 2021-10-15 DIAGNOSIS — O09293 Supervision of pregnancy with other poor reproductive or obstetric history, third trimester: Secondary | ICD-10-CM

## 2021-10-15 DIAGNOSIS — O99213 Obesity complicating pregnancy, third trimester: Secondary | ICD-10-CM

## 2021-10-16 NOTE — Progress Notes (Signed)
?  [place] Prenatal Visit ? ?Alice Gibson is a 21 y.o. [phone] at [redacted]w[redacted]d here for routine follow up. She is dated by early ultrasound.  She reports contractions since 2 weeks ago. Primarily at nighttime .  She reports fetal movement. She denies vaginal bleeding, contractions, or loss of fluid.  See flow sheet for details. ? ?Vitals:  ? 10/17/21 1127  ?BP: 101/75  ?Pulse: 86  ?Temp: 98.3 ?F (36.8 ?C)  ? ?General: female appearing stated age in no acute distress ?Cardio: Normal S1 and S2, no S3 or S4. Rhythm is regular. No murmurs or rubs.  Bilateral radial pulses palpable ?Pulm: Clear to auscultation bilaterally, no crackles, wheezing, or diminished breath sounds. Normal respiratory effort, stable on RA ?Abdomen: Bowel sounds normal. Abdomen soft and non-tender. Gravid uterus  ? ? ?A/P: Pregnancy at [redacted]w[redacted]d.  Doing well.   ?Routine prenatal care:  ?Dating reviewed, dating tab is correct ?Fetal heart tones: Appropriate ?Fundal height: within expected range.  ?The patient does not have a history of HSV and valacyclovir is not indicated at this time.  ?The patient does not have a history of Cesarean delivery and no referral to [facility] is indicated ?Infant feeding choice: Formula feeding  ?Contraception choice: Nexplanon  ?Infant circumcision desired not applicable ?Influenza vaccine not administered as not influenza season.   ?Tdap was given today. ?COVID vaccination was discussed and patient previously had vaccines (reports having all 5).  ?Childbirth and education classes were offered. ?Pregnancy education regarding benefits of breastfeeding, contraception, fetal growth, expected weight gain, and safe infant sleep were discussed.  ?Preterm labor and fetal movement precautions reviewed. ? ? ?2. Pregnancy issues include the following and were addressed as appropriate today: ? Obesity in Pregnancy (BMI 50): weekly BPP scheduled with MFM, appt today 10/17/21 ? Problem list and pregnancy  box updated: Yes.  ? ?Patient will need [place] clinic in third trimester; no dates available at this time, will have patient schedule with front office staff.  ? ?Follow up 2 weeks. ? ?

## 2021-10-17 ENCOUNTER — Ambulatory Visit: Payer: Medicaid Other | Attending: Obstetrics

## 2021-10-17 ENCOUNTER — Encounter: Payer: Self-pay | Admitting: Family Medicine

## 2021-10-17 ENCOUNTER — Ambulatory Visit (INDEPENDENT_AMBULATORY_CARE_PROVIDER_SITE_OTHER): Payer: Medicaid Other | Admitting: Family Medicine

## 2021-10-17 ENCOUNTER — Ambulatory Visit: Payer: Medicaid Other | Admitting: *Deleted

## 2021-10-17 VITALS — BP 120/58 | HR 86

## 2021-10-17 VITALS — BP 101/75 | HR 86 | Temp 98.3°F | Wt 268.8 lb

## 2021-10-17 DIAGNOSIS — Z6841 Body Mass Index (BMI) 40.0 and over, adult: Secondary | ICD-10-CM | POA: Diagnosis present

## 2021-10-17 DIAGNOSIS — Z23 Encounter for immunization: Secondary | ICD-10-CM | POA: Diagnosis present

## 2021-10-17 DIAGNOSIS — O09293 Supervision of pregnancy with other poor reproductive or obstetric history, third trimester: Secondary | ICD-10-CM

## 2021-10-17 DIAGNOSIS — Z3483 Encounter for supervision of other normal pregnancy, third trimester: Secondary | ICD-10-CM

## 2021-10-17 DIAGNOSIS — O365990 Maternal care for other known or suspected poor fetal growth, unspecified trimester, not applicable or unspecified: Secondary | ICD-10-CM | POA: Diagnosis present

## 2021-10-17 DIAGNOSIS — Z8759 Personal history of other complications of pregnancy, childbirth and the puerperium: Secondary | ICD-10-CM | POA: Insufficient documentation

## 2021-10-17 DIAGNOSIS — Z3482 Encounter for supervision of other normal pregnancy, second trimester: Secondary | ICD-10-CM

## 2021-10-17 DIAGNOSIS — Z3A33 33 weeks gestation of pregnancy: Secondary | ICD-10-CM | POA: Diagnosis not present

## 2021-10-17 DIAGNOSIS — O99213 Obesity complicating pregnancy, third trimester: Secondary | ICD-10-CM | POA: Diagnosis not present

## 2021-10-17 NOTE — Patient Instructions (Signed)
Preterm Labor The normal length of a pregnancy is 39-41 weeks. Preterm labor is when labor starts before 37 completed weeks of pregnancy. Babies who are born prematurely and survive may not be fully developed and may be at an increased risk for long-term problems such as cerebral palsy, developmental delays, and vision andhearing problems. Babies who are born too early may have problems soon after birth. Premature babies may have problems regulating blood sugar, body temperature, heart rate, and breathing rate. These babies often have trouble with feeding. The risk ofhaving problems is highest for babies who are born before 34 weeks of pregnancy. What are the causes? The exact cause of this condition is not known. What increases the risk? You are more likely to have preterm labor if you have certain risk factors that relate to your medical history, problems with present and past pregnancies, andlifestyle factors. Medical history You have abnormalities of the uterus, including a short cervix. You have STIs (sexually transmitted infections) or other infections of the urinary tract and the vagina. You have chronic illnesses, such as blood clotting problems, diabetes, or high blood pressure. You are overweight or underweight. Present and past pregnancies You have had preterm labor before. You are pregnant with twins or other multiples. You have been diagnosed with a condition in which the placenta covers your cervix (placenta previa). You waited less than 18 months between giving birth and becoming pregnant again. Your unborn baby has some abnormalities. You have vaginal bleeding during pregnancy. You became pregnant through in vitro fertilization (IVF). Lifestyle and environmental factors You use tobacco products or drink alcohol. You use drugs. You have stress and no social support. You experience domestic violence. You are exposed to certain chemicals or environmental pollutants. Other  factors You are younger than age 17 or older than age 35. What are the signs or symptoms? Symptoms of this condition include: Cramps similar to those that can happen during a menstrual period. The cramps may happen with diarrhea. Pain in the abdomen or lower back. Regular contractions that may feel like tightening of the abdomen. A feeling of increased pressure in the pelvis. Increased watery or bloody mucus discharge from the vagina. Water breaking (ruptured amniotic sac). How is this diagnosed? This condition is diagnosed based on: Your medical history and a physical exam. A pelvic exam. An ultrasound. Monitoring your uterus for contractions. Other tests, including: A swab of the cervix to check for a chemical called fetal fibronectin. Urine tests. How is this treated? Treatment for this condition depends on the length of your pregnancy, your condition, and the health of your baby. Treatment may include: Taking medicines, such as: Hormone medicines. These may be given early in pregnancy to help support the pregnancy. Medicines to stop contractions. Medicines to help mature the baby's lungs. These may be prescribed if the risk of delivery is high. Medicines to help protect your baby from brain and nerve complications such as cerebral palsy. Bed rest. If the labor happens before 34 weeks of pregnancy, you may need to stay in the hospital. Delivery of the baby. Follow these instructions at home:  Do not use any products that contain nicotine or tobacco. These products include cigarettes, chewing tobacco, and vaping devices, such as e-cigarettes. If you need help quitting, ask your health care provider. Do not drink alcohol. Take over-the-counter and prescription medicines only as told by your health care provider. Rest as told by your health care provider. Return to your normal activities as told by your   health care provider. Ask your health care provider what activities are safe for  you. Keep all follow-up visits. This is important. How is this prevented? To increase your chance of having a full-term pregnancy: Do not use drugs or take medicines that have not been prescribed to you during your pregnancy. Talk with your health care provider before taking any herbal supplements, even if you have been taking them regularly. Make sure you gain a healthy amount of weight during your pregnancy. Watch for infection. If you think that you might have an infection, get it checked right away. Symptoms of infection may include: Fever. Abnormal vaginal discharge or discharge that smells bad. Pain or burning with urination. Needing to urinate urgently. Frequently urinating or passing small amounts of urine frequently. Blood in your urine or urine that smells bad or unusual. Where to find more information [place] Department of Health and Human Services Office on Women's Health: www.womenshealth.gov The American College of Obstetricians and Gynecologists: www.acog.org Centers for Disease Control and Prevention, Preterm Birth: www.cdc.gov Contact a health care provider if: You think you are going into preterm labor. You have signs or symptoms of preterm labor. You have symptoms of infection. Get help right away if: You are having regular, painful contractions every 5 minutes or less. Your water breaks. Summary Preterm labor is labor that starts before you reach 37 weeks of pregnancy. Delivering your baby early increases your baby's risk of developing long-term problems. You are more likely to have preterm labor if you have certain risk factors that relate to your medical history, problems with present and past pregnancies, and lifestyle factors. Keep all follow-up visits. This is important. Contact a health care provider if you have signs or symptoms of preterm labor. This information is not intended to replace advice given to you by your health care provider. Make sure you discuss  any questions you have with your healthcare provider. Document Revised: 07/04/2020 Document Reviewed: 07/04/2020 Elsevier Patient Education  2022 Elsevier Inc.  

## 2021-10-24 ENCOUNTER — Ambulatory Visit: Payer: Medicaid Other | Attending: Obstetrics and Gynecology

## 2021-10-24 ENCOUNTER — Ambulatory Visit: Payer: Medicaid Other | Admitting: *Deleted

## 2021-10-24 VITALS — BP 120/68 | HR 95

## 2021-10-24 DIAGNOSIS — O99213 Obesity complicating pregnancy, third trimester: Secondary | ICD-10-CM

## 2021-10-24 DIAGNOSIS — Z3A34 34 weeks gestation of pregnancy: Secondary | ICD-10-CM

## 2021-10-24 DIAGNOSIS — E669 Obesity, unspecified: Secondary | ICD-10-CM

## 2021-10-24 DIAGNOSIS — O365930 Maternal care for other known or suspected poor fetal growth, third trimester, not applicable or unspecified: Secondary | ICD-10-CM | POA: Insufficient documentation

## 2021-10-31 ENCOUNTER — Ambulatory Visit: Payer: Medicaid Other | Attending: Obstetrics and Gynecology

## 2021-10-31 ENCOUNTER — Ambulatory Visit: Payer: Medicaid Other | Admitting: *Deleted

## 2021-10-31 VITALS — BP 121/63 | HR 87

## 2021-10-31 DIAGNOSIS — E669 Obesity, unspecified: Secondary | ICD-10-CM

## 2021-10-31 DIAGNOSIS — O365930 Maternal care for other known or suspected poor fetal growth, third trimester, not applicable or unspecified: Secondary | ICD-10-CM | POA: Diagnosis present

## 2021-10-31 DIAGNOSIS — O09293 Supervision of pregnancy with other poor reproductive or obstetric history, third trimester: Secondary | ICD-10-CM | POA: Diagnosis not present

## 2021-10-31 DIAGNOSIS — O99213 Obesity complicating pregnancy, third trimester: Secondary | ICD-10-CM | POA: Insufficient documentation

## 2021-10-31 DIAGNOSIS — Z3A35 35 weeks gestation of pregnancy: Secondary | ICD-10-CM | POA: Diagnosis not present

## 2021-11-06 NOTE — Progress Notes (Deleted)
  [facility] Prenatal Visit  Alice Gibson is a 21 y.o. [phone] at [redacted]w[redacted]d here for routine follow up. She is dated by {Ob dating:14516}.  She reports {symptoms; pregnancy related:14538}. She reports fetal movement. She denies vaginal bleeding, contractions, or loss of fluid. See flow sheet for details.  There were no vitals filed for this visit.  A/P: Pregnancy at [redacted]w[redacted]d.  Doing well.   Routine prenatal care  Dating reviewed, dating tab is {correct:23336::"correct"} Fetal heart tones {appropriate:23337} Fundal height {fundal height:23342::"within expected range. "} Fetal position confirmed {vertex:23350::"Vertex"} using {ultrasound or [place]:23351::"Ultrasound "}.  GBS {collected today :23349::"collected today. "}.  Repeat GC/CT {collected today :23349::"collected today. "} The patient does not have a history of HSV and valacyclovir is not indicated at this time.  Infant feeding choice: Formula feeding  Contraception choice: Nexplanon  Influenza vaccine previously administered.  Sept 2022 Tdap previously administered between 27-36 weeks  10/17/21 COVID vaccination was discussed and received vaccine Jan 2023 bivalent booster.  Pregnancy education regarding preterm labor, fetal movement,  benefits of breastfeeding, contraception, fetal growth, expected weight gain, and safe infant sleep were discussed.    2. Pregnancy issues include the following and were addressed as appropriate today:   Obesity in Pregnancy with hx of prior growth restriction: BPP scheduled for today 11/07/21, weekly BPPs scheduled last appt 4/19 with 8/8 scoring and normal amniotic fluid  Problem list and pregnancy box updated: {yes/no:20286::"Yes"}.  Follow up 1 week.

## 2021-11-07 ENCOUNTER — Ambulatory Visit: Payer: Medicaid Other | Admitting: *Deleted

## 2021-11-07 ENCOUNTER — Ambulatory Visit: Payer: Medicaid Other | Attending: Obstetrics and Gynecology

## 2021-11-07 ENCOUNTER — Ambulatory Visit: Payer: Medicaid Other | Admitting: Family Medicine

## 2021-11-07 ENCOUNTER — Other Ambulatory Visit: Payer: Medicaid Other

## 2021-11-07 VITALS — BP 112/50 | HR 77

## 2021-11-07 DIAGNOSIS — E669 Obesity, unspecified: Secondary | ICD-10-CM | POA: Diagnosis not present

## 2021-11-07 DIAGNOSIS — O365930 Maternal care for other known or suspected poor fetal growth, third trimester, not applicable or unspecified: Secondary | ICD-10-CM

## 2021-11-07 DIAGNOSIS — O99213 Obesity complicating pregnancy, third trimester: Secondary | ICD-10-CM

## 2021-11-07 DIAGNOSIS — Z3A36 36 weeks gestation of pregnancy: Secondary | ICD-10-CM

## 2021-11-07 DIAGNOSIS — O09293 Supervision of pregnancy with other poor reproductive or obstetric history, third trimester: Secondary | ICD-10-CM | POA: Insufficient documentation

## 2021-11-14 ENCOUNTER — Ambulatory Visit (INDEPENDENT_AMBULATORY_CARE_PROVIDER_SITE_OTHER): Payer: Medicaid Other | Admitting: Family Medicine

## 2021-11-14 ENCOUNTER — Ambulatory Visit: Payer: Medicaid Other

## 2021-11-14 ENCOUNTER — Ambulatory Visit (INDEPENDENT_AMBULATORY_CARE_PROVIDER_SITE_OTHER): Payer: Medicaid Other

## 2021-11-14 ENCOUNTER — Other Ambulatory Visit (HOSPITAL_COMMUNITY)
Admission: RE | Admit: 2021-11-14 | Discharge: 2021-11-14 | Disposition: A | Discharge status: Home / Self Care | Payer: Medicaid Other | Source: Ambulatory Visit | Attending: Family Medicine | Admitting: Family Medicine

## 2021-11-14 ENCOUNTER — Ambulatory Visit (INDEPENDENT_AMBULATORY_CARE_PROVIDER_SITE_OTHER): Payer: Medicaid Other | Admitting: General Practice

## 2021-11-14 VITALS — BP 136/74 | HR 74

## 2021-11-14 VITALS — BP 112/70 | HR 70

## 2021-11-14 VITALS — BP 131/90 | HR 70 | Temp 99.0°F | Wt 268.6 lb

## 2021-11-14 DIAGNOSIS — Z3483 Encounter for supervision of other normal pregnancy, third trimester: Secondary | ICD-10-CM

## 2021-11-14 DIAGNOSIS — O99210 Obesity complicating pregnancy, unspecified trimester: Secondary | ICD-10-CM | POA: Diagnosis not present

## 2021-11-14 DIAGNOSIS — Z3493 Encounter for supervision of normal pregnancy, unspecified, third trimester: Secondary | ICD-10-CM | POA: Diagnosis not present

## 2021-11-14 DIAGNOSIS — R030 Elevated blood-pressure reading, without diagnosis of hypertension: Secondary | ICD-10-CM

## 2021-11-14 NOTE — Patient Instructions (Signed)
Signs and Symptoms of Labor Labor is the body's natural process of moving the baby and the placenta out of the uterus. The process of labor usually starts when the baby is full-term, between 39 and 41 weeks of pregnancy. Signs and symptoms that you are close to going into labor As your body prepares for labor and the birth of your baby, you may notice the following symptoms in the weeks and days before true labor starts: Passing a small amount of thick, bloody mucus from your vagina. This is called normal bloody show or losing your mucus plug. This may happen more than a week before labor begins, or right before labor begins, as the opening of the cervix starts to widen (dilate). For some women, the entire mucus plug passes at once. For others, pieces of the mucus plug may gradually pass over several days. Your baby moving (dropping) lower in your pelvis to get into position for birth (lightening). When this happens, you may feel more pressure on your bladder and pelvic bone and less pressure on your ribs. This may make it easier to breathe. It may also cause you to need to urinate more often and have problems with bowel movements. Having "practice contractions," also called Braxton Hicks contractions or false labor. These occur at irregular (unevenly spaced) intervals that are more than 10 minutes apart. False labor contractions are common after exercise or sexual activity. They will stop if you change position, rest, or drink fluids. These contractions are usually mild and do not get stronger over time. They may feel like: A backache or back pain. Mild cramps, similar to menstrual cramps. Tightening or pressure in your abdomen. Other early symptoms include: Nausea or loss of appetite. Diarrhea. Having a sudden burst of energy, or feeling very tired. Mood changes. Having trouble sleeping. Signs and symptoms that labor has begun Signs that you are in labor may include: Having contractions that come  at regular (evenly spaced) intervals and increase in intensity. This may feel like more intense tightening or pressure in your abdomen that moves to your back. Contractions may also feel like rhythmic pain in your upper thighs or back that comes and goes at regular intervals. If you are delivering for the first time, this change in intensity of contractions often occurs at a more gradual pace. If you have given birth before, you may notice a more rapid progression of contraction changes. Feeling pressure in the vaginal area. Your water breaking (rupture of membranes). This is when the sac of fluid that surrounds your baby breaks. Fluid leaking from your vagina may be clear or blood-tinged. Labor usually starts within 24 hours of your water breaking, but it may take longer to begin. Some people may feel a sudden gush of fluid; others may notice repeatedly damp underwear. Follow these instructions at home:  When labor starts, or if your water breaks, call your health care provider or nurse care line. Based on your situation, they will determine when you should go in for an exam. During early labor, you may be able to rest and manage symptoms at home. Some strategies to try at home include: Breathing and relaxation techniques. Taking a warm bath or shower. Listening to music. Using a heating pad on the lower back for pain. If directed, apply heat to the area as often as told by your health care provider. Use the heat source that your health care provider recommends, such as a moist heat pack or a heating pad. Place a   towel between your skin and the heat source. Leave the heat on for 20-30 minutes. Remove the heat if your skin turns bright red. This is especially important if you are unable to feel pain, heat, or cold. You have a greater risk of getting burned. Contact a health care provider if: Your labor has started. Your water breaks. You have nausea, vomiting, or diarrhea. Get help right away  if: You have painful, regular contractions that are 5 minutes apart or less. Labor starts before you are [redacted] weeks along in your pregnancy. You have a fever. You have bright red blood coming from your vagina. You do not feel your baby moving. You have a severe headache with or without vision problems. You have chest pain or shortness of breath. These symptoms may represent a serious problem that is an emergency. Do not wait to see if the symptoms will go away. Get medical help right away. Call your local emergency services (911 in the [place] Do not drive yourself to the hospital. Summary Labor is your body's natural process of moving your baby and the placenta out of your uterus. The process of labor usually starts when your baby is full-term, between 39 and 40 weeks of pregnancy. When labor starts, or if your water breaks, call your health care provider or nurse care line. Based on your situation, they will determine when you should go in for an exam. This information is not intended to replace advice given to you by your health care provider. Make sure you discuss any questions you have with your health care provider. Document Revised: 11/14/2020 Document Reviewed: 11/14/2020 Elsevier Patient Education  2023 Elsevier Inc.  

## 2021-11-14 NOTE — Progress Notes (Signed)
Recheck blood pressure 123/49 and heart rate at 77 ?

## 2021-11-14 NOTE — Progress Notes (Signed)
Patient presents in nurse clinic for repeat BP and labs.  ? ?BP this afternoon was 112/70. ? ?Patient reports this morning she was under a lot of stress and contributes this to earlier elevated BP. ? ?Patient scheduled for 5/10 for next prenatal visit.  ? ?Patient dropped off at lab.  ?

## 2021-11-14 NOTE — Progress Notes (Addendum)
?  [facility] Prenatal Visit ? ?Alice Gibson is a 21 y.o. [phone] at [redacted]w[redacted]d here for routine follow up. She is dated by midtrimester ultrasound.  She reports headache. She reports fetal movement. She denies vaginal bleeding, contractions, or loss of fluid. See flow sheet for details. ? ?Vitals:  ? 11/14/21 1142  ?BP: 131/90  ?Pulse: 70  ?Temp: 99 ?F (37.2 ?C)  ? ?Pelvic exam: normal external genitalia, vulva, vagina, cervix, uterus and adnexa, exam chaperoned by Gilberto Better, CMA. ? ?A/P: Pregnancy at [redacted]w[redacted]d.  Doing well.   ?Routine prenatal care:  ?Dating reviewed, dating tab is correct ?Fetal heart tones Appropriate ?Fundal height within expected range.  ?Fetal position confirmed Vertex using Ultrasound  11/07/2021.  ?Infant feeding choice: Both  ?Contraception choice: Nexplanon  ?Infant circumcision desired not applicable ?Pain control in labor discussed and patient desires natural childbirth.  ?Influenza vaccine previously administered.   ?Tdap previously administered between 27-36 weeks  ?GBS and gc/chlamydia testing results were collected today.   ?Pregnancy education regarding labor, fetal movement,  benefits of breastfeeding, contraception, and safe infant sleep were discussed.  ?Labor and fetal movement precautions reviewed. ?Induction of labor discussed. Needs to be scheduled for induction at approximately 41 weeks. BPP scheduled today follows with MFM ? ?2. Pregnancy issues include the following and were addressed as appropriate today: ? ?  Obesity in Pregnancy (BMI 50): weekly BPP scheduled with MFM, appt today  ?Elevated blood pressure in pregnancy- Endorsing headache. Denies vision changes and RUQ pain. Obtain CBC, CMP, and PCR. Follow up this afternoon for BP recheck. If remains elevated consider MAU follow up.  ? Problem list and pregnancy box updated: Yes.  ? ?Follow up 1 week for routine PNC.  ? ?

## 2021-11-14 NOTE — Progress Notes (Signed)
Pt informed that the ultrasound is considered a limited OB ultrasound and is not intended to be a complete ultrasound exam.  Patient also informed that the ultrasound is not being completed with the intent of assessing for fetal or placental anomalies or any pelvic abnormalities.  Explained that the purpose of today?s ultrasound is to assess for  BPP, presentation, and AFI.  Patient acknowledges the purpose of the exam and the limitations of the study.    ? ?Aleczander Fandino H RN BSN ?11/14/21 ? ?

## 2021-11-15 LAB — CBC
Hematocrit: 34.9 % (ref 34.0–46.6)
Hemoglobin: 11.6 g/dL (ref 11.1–15.9)
MCH: 29.1 pg (ref 26.6–33.0)
MCHC: 33.2 g/dL (ref 31.5–35.7)
MCV: 88 fL (ref 79–97)
Platelets: 268 10*3/uL (ref 150–450)
RBC: 3.99 x10E6/uL (ref 3.77–5.28)
RDW: 13.3 % (ref 11.7–15.4)
WBC: 9.9 10*3/uL (ref 3.4–10.8)

## 2021-11-15 LAB — CERVICOVAGINAL ANCILLARY ONLY
Chlamydia: NEGATIVE
Comment: NEGATIVE
Comment: NORMAL
Neisseria Gonorrhea: NEGATIVE

## 2021-11-15 LAB — PROTEIN / CREATININE RATIO, URINE
Creatinine, Urine: 256.4 mg/dL
Protein, Ur: 37.1 mg/dL
Protein/Creat Ratio: 145 mg/g creat (ref 0–200)

## 2021-11-15 LAB — COMPREHENSIVE METABOLIC PANEL
ALT: 18 IU/L (ref 0–32)
AST: 16 IU/L (ref 0–40)
Albumin/Globulin Ratio: 1.4 (ref 1.2–2.2)
Albumin: 3.4 g/dL — ABNORMAL LOW (ref 3.9–5.0)
Alkaline Phosphatase: 159 IU/L — ABNORMAL HIGH (ref 44–121)
BUN/Creatinine Ratio: 10 (ref 9–23)
BUN: 6 mg/dL (ref 6–20)
Bilirubin Total: 0.5 mg/dL (ref 0.0–1.2)
CO2: 19 mmol/L — ABNORMAL LOW (ref 20–29)
Calcium: 8.5 mg/dL — ABNORMAL LOW (ref 8.7–10.2)
Chloride: 105 mmol/L (ref 96–106)
Creatinine, Ser: 0.58 mg/dL (ref 0.57–1.00)
Globulin, Total: 2.5 g/dL (ref 1.5–4.5)
Glucose: 81 mg/dL (ref 70–99)
Potassium: 4.3 mmol/L (ref 3.5–5.2)
Sodium: 136 mmol/L (ref 134–144)
Total Protein: 5.9 g/dL — ABNORMAL LOW (ref 6.0–8.5)
eGFR: 132 mL/min/{1.73_m2} (ref >59)

## 2021-11-18 LAB — CULTURE, BETA STREP (GROUP B ONLY): Strep Gp B Culture: NEGATIVE

## 2021-11-21 ENCOUNTER — Ambulatory Visit (INDEPENDENT_AMBULATORY_CARE_PROVIDER_SITE_OTHER): Payer: Medicaid Other

## 2021-11-21 ENCOUNTER — Other Ambulatory Visit: Payer: Self-pay

## 2021-11-21 ENCOUNTER — Encounter: Payer: Self-pay | Admitting: *Deleted

## 2021-11-21 ENCOUNTER — Ambulatory Visit (INDEPENDENT_AMBULATORY_CARE_PROVIDER_SITE_OTHER): Payer: Medicaid Other | Admitting: Family Medicine

## 2021-11-21 ENCOUNTER — Ambulatory Visit: Payer: Medicaid Other | Admitting: *Deleted

## 2021-11-21 VITALS — BP 109/69 | HR 67 | Wt 270.8 lb

## 2021-11-21 DIAGNOSIS — Z3483 Encounter for supervision of other normal pregnancy, third trimester: Secondary | ICD-10-CM

## 2021-11-21 DIAGNOSIS — Z3A38 38 weeks gestation of pregnancy: Secondary | ICD-10-CM | POA: Diagnosis not present

## 2021-11-21 DIAGNOSIS — O99210 Obesity complicating pregnancy, unspecified trimester: Secondary | ICD-10-CM

## 2021-11-21 NOTE — Progress Notes (Signed)
?  [facility] Prenatal Visit ? ?Alice Gibson is a 21 y.o. [phone] at [redacted]w[redacted]d here for routine follow up. She is dated by early ultrasound.  She reports occasional contractions. She reports fetal movement. She denies vaginal bleeding, contractions (mainly at night and are irregular), or loss of fluid. See flow sheet for details. ? ?Vitals:  ? 11/21/21 1000  ?BP: 109/69  ?Pulse: 67  ? ? ?A/P: Pregnancy at [redacted]w[redacted]d.  Doing well.   ?Routine prenatal care:  ?Dating reviewed, dating tab is correct ?Fetal heart tones Appropriate ?Fundal height within expected range.  ?Fetal position confirmed Vertex using Ultrasound .  ?Infant feeding choice: Both  ?Contraception choice: Nexplanon  ?Pain control in labor discussed and patient desires Epidural.  ?Influenza vaccine previously administered.   ?Tdap previously administered between 27-36 weeks  ?GBS and gc/chlamydia testing results were reviewed today.   ?Pregnancy education regarding labor, fetal movement,  benefits of breastfeeding, contraception, and safe infant sleep were discussed.  ?Labor and fetal movement precautions reviewed. ?Induction of labor discussed. Scheduled for induction at approximately 41 weeks. BPP scheduled between 40-41 weeks.  ? ?2. Pregnancy issues include the following and were addressed as appropriate today: ? ? Previously elevated blood pressure: BP today is 109/69 which is appropriate.. ? Induction has been scheduled for 5/25 if patient has not delivered by that time. ? Patient has appointment with MFM this afternoon for BPP ? Problem list and pregnancy box updated: Yes.  ? ?Follow up 1 week.  ? ?

## 2021-11-21 NOTE — Patient Instructions (Signed)
Go to the MAU at [facility] at [facility] if: ?You begin to have strong, frequent contractions ?Your water breaks.  Sometimes it is a big gush of fluid, sometimes it is just a trickle that keeps getting your underwear wet or running down your legs ?You have vaginal bleeding.  It is normal to have a small amount of spotting if your cervix was checked.  ?You do not feel your baby moving like normal.  If you do not, get something to eat and drink and lay down and focus on feeling your baby move.   If your baby is still not moving like normal, you should go to MAU. ? ? ?Your induction has been scheduled for 5/25 if you not delivered by then ?

## 2021-11-22 ENCOUNTER — Telehealth (HOSPITAL_COMMUNITY): Payer: Self-pay | Admitting: *Deleted

## 2021-11-22 ENCOUNTER — Encounter (HOSPITAL_COMMUNITY): Payer: Self-pay | Admitting: *Deleted

## 2021-11-22 NOTE — Telephone Encounter (Signed)
Preadmission screen  

## 2021-11-23 ENCOUNTER — Ambulatory Visit: Payer: Medicaid Other | Admitting: Family Medicine

## 2021-11-25 ENCOUNTER — Inpatient Hospital Stay (HOSPITAL_COMMUNITY)
Admission: AD | Admit: 2021-11-25 | Discharge: 2021-11-27 | DRG: 807 | Disposition: A | Discharge status: Home / Self Care | Payer: Medicaid Other | Attending: Obstetrics & Gynecology | Admitting: Obstetrics & Gynecology

## 2021-11-25 ENCOUNTER — Other Ambulatory Visit: Payer: Self-pay

## 2021-11-25 ENCOUNTER — Encounter (HOSPITAL_COMMUNITY): Payer: Self-pay | Admitting: Obstetrics & Gynecology

## 2021-11-25 DIAGNOSIS — J45990 Exercise induced bronchospasm: Secondary | ICD-10-CM | POA: Diagnosis present

## 2021-11-25 DIAGNOSIS — O99214 Obesity complicating childbirth: Secondary | ICD-10-CM | POA: Diagnosis present

## 2021-11-25 DIAGNOSIS — O99820 Streptococcus B carrier state complicating pregnancy: Secondary | ICD-10-CM | POA: Diagnosis not present

## 2021-11-25 DIAGNOSIS — R8271 Bacteriuria: Secondary | ICD-10-CM | POA: Diagnosis present

## 2021-11-25 DIAGNOSIS — O26893 Other specified pregnancy related conditions, third trimester: Secondary | ICD-10-CM | POA: Diagnosis present

## 2021-11-25 DIAGNOSIS — Z87891 Personal history of nicotine dependence: Secondary | ICD-10-CM | POA: Diagnosis not present

## 2021-11-25 DIAGNOSIS — Z3A39 39 weeks gestation of pregnancy: Secondary | ICD-10-CM

## 2021-11-25 DIAGNOSIS — Z3492 Encounter for supervision of normal pregnancy, unspecified, second trimester: Secondary | ICD-10-CM

## 2021-11-25 DIAGNOSIS — O99824 Streptococcus B carrier state complicating childbirth: Principal | ICD-10-CM | POA: Diagnosis present

## 2021-11-25 DIAGNOSIS — Z8669 Personal history of other diseases of the nervous system and sense organs: Secondary | ICD-10-CM

## 2021-11-25 DIAGNOSIS — O4202 Full-term premature rupture of membranes, onset of labor within 24 hours of rupture: Secondary | ICD-10-CM | POA: Diagnosis not present

## 2021-11-25 DIAGNOSIS — Z30017 Encounter for initial prescription of implantable subdermal contraceptive: Secondary | ICD-10-CM | POA: Diagnosis not present

## 2021-11-25 LAB — TYPE AND SCREEN
ABO/RH(D): A POS
Antibody Screen: NEGATIVE

## 2021-11-25 LAB — CBC
HCT: 36.4 % (ref 36.0–46.0)
Hemoglobin: 12.7 g/dL (ref 12.0–15.0)
MCH: 29.8 pg (ref 26.0–34.0)
MCHC: 34.9 g/dL (ref 30.0–36.0)
MCV: 85.4 fL (ref 80.0–100.0)
Platelets: 238 10*3/uL (ref 150–400)
RBC: 4.26 MIL/uL (ref 3.87–5.11)
RDW: 13.4 % (ref 11.5–15.5)
WBC: 11.1 10*3/uL — ABNORMAL HIGH (ref 4.0–10.5)
nRBC: 0 % (ref 0.0–0.2)

## 2021-11-25 MED ORDER — MEASLES, MUMPS & RUBELLA VAC IJ SOLR: 0.5000 mL | Freq: Once | INTRAMUSCULAR | Status: DC

## 2021-11-25 MED ORDER — LACTATED RINGERS IV SOLN: 500.0000 mL | INTRAVENOUS | Status: DC | PRN

## 2021-11-25 MED ORDER — PHENYLEPHRINE 80 MCG/ML (10ML) SYRINGE FOR IV PUSH (FOR BLOOD PRESSURE SUPPORT): 80.0000 ug | PREFILLED_SYRINGE | INTRAVENOUS | Status: DC | PRN

## 2021-11-25 MED ORDER — SODIUM CHLORIDE 0.9 % IV SOLN
5.0000 10*6.[IU] | Freq: Once | INTRAVENOUS | Status: DC
  Administered 2021-11-25: 5 10*6.[IU] via INTRAVENOUS
  Filled 2021-11-25: qty 5

## 2021-11-25 MED ORDER — PENICILLIN G POT IN DEXTROSE 60000 UNIT/ML IV SOLN: 3.0000 10*6.[IU] | INTRAVENOUS | Status: DC

## 2021-11-25 MED ORDER — ACETAMINOPHEN 325 MG PO TABS
650.0000 mg | ORAL_TABLET | ORAL | Status: DC | PRN
  Administered 2021-11-26 – 2021-11-27 (×2): 650 mg via ORAL
  Filled 2021-11-25 (×2): qty 2

## 2021-11-25 MED ORDER — DIBUCAINE (PERIANAL) 1 % EX OINT
1.0000 | TOPICAL_OINTMENT | CUTANEOUS | Status: DC | PRN
Start: 2021-11-25 — End: 2021-11-27

## 2021-11-25 MED ORDER — LACTATED RINGERS IV SOLN: 500.0000 mL | Freq: Once | INTRAVENOUS | Status: DC

## 2021-11-25 MED ORDER — SOD CITRATE-CITRIC ACID 500-334 MG/5ML PO SOLN: 30.0000 mL | ORAL | Status: DC | PRN

## 2021-11-25 MED ORDER — ONDANSETRON HCL 4 MG/2ML IJ SOLN: 4.0000 mg | Freq: Four times a day (QID) | INTRAMUSCULAR | Status: DC | PRN

## 2021-11-25 MED ORDER — MEDROXYPROGESTERONE ACETATE 150 MG/ML IM SUSP: 150.0000 mg | INTRAMUSCULAR | Status: DC | PRN

## 2021-11-25 MED ORDER — SIMETHICONE 80 MG PO CHEW: 80.0000 mg | CHEWABLE_TABLET | ORAL | Status: DC | PRN

## 2021-11-25 MED ORDER — DIPHENHYDRAMINE HCL 25 MG PO CAPS: 25.0000 mg | ORAL_CAPSULE | Freq: Four times a day (QID) | ORAL | Status: DC | PRN

## 2021-11-25 MED ORDER — OXYCODONE-ACETAMINOPHEN 5-325 MG PO TABS: 2.0000 | ORAL_TABLET | ORAL | Status: DC | PRN

## 2021-11-25 MED ORDER — LORATADINE 10 MG PO TABS
10.0000 mg | ORAL_TABLET | Freq: Every day | ORAL | Status: DC
Start: 2021-11-25 — End: 2021-11-25
  Filled 2021-11-25: qty 1

## 2021-11-25 MED ORDER — COCONUT OIL OIL: 1.0000 "application " | TOPICAL_OIL | Status: DC | PRN

## 2021-11-25 MED ORDER — OXYCODONE-ACETAMINOPHEN 5-325 MG PO TABS: 1.0000 | ORAL_TABLET | ORAL | Status: DC | PRN

## 2021-11-25 MED ORDER — OXYTOCIN-SODIUM CHLORIDE 30-0.9 UT/500ML-% IV SOLN
2.5000 [IU]/h | INTRAVENOUS | Status: DC
  Filled 2021-11-25: qty 500

## 2021-11-25 MED ORDER — ONDANSETRON HCL 4 MG PO TABS: 4.0000 mg | ORAL_TABLET | ORAL | Status: DC | PRN

## 2021-11-25 MED ORDER — ETONOGESTREL 68 MG ~~LOC~~ IMPL
68.0000 mg | DRUG_IMPLANT | Freq: Once | SUBCUTANEOUS | Status: AC
Start: 2021-11-25 — End: 2021-11-25
  Administered 2021-11-25: 68 mg via SUBCUTANEOUS
  Filled 2021-11-25: qty 1

## 2021-11-25 MED ORDER — PRENATAL MULTIVITAMIN CH
1.0000 | ORAL_TABLET | Freq: Every day | ORAL | Status: DC
  Administered 2021-11-26: 1 via ORAL
  Filled 2021-11-25: qty 1

## 2021-11-25 MED ORDER — IBUPROFEN 600 MG PO TABS
600.0000 mg | ORAL_TABLET | Freq: Four times a day (QID) | ORAL | Status: DC
  Administered 2021-11-25 – 2021-11-27 (×8): 600 mg via ORAL
  Filled 2021-11-25 (×8): qty 1

## 2021-11-25 MED ORDER — EPHEDRINE 5 MG/ML INJ: 10.0000 mg | INTRAVENOUS | Status: DC | PRN

## 2021-11-25 MED ORDER — SODIUM CHLORIDE 0.9 % IV SOLN
2.0000 g | Freq: Once | INTRAVENOUS | Status: AC
  Administered 2021-11-25: 2 g via INTRAVENOUS
  Filled 2021-11-25: qty 2000

## 2021-11-25 MED ORDER — WITCH HAZEL-GLYCERIN EX PADS: 1.0000 "application " | MEDICATED_PAD | CUTANEOUS | Status: DC | PRN

## 2021-11-25 MED ORDER — LIDOCAINE HCL 1 % IJ SOLN
0.0000 mL | Freq: Once | INTRAMUSCULAR | Status: AC | PRN
  Administered 2021-11-25: 20 mL via INTRADERMAL
  Filled 2021-11-25: qty 20

## 2021-11-25 MED ORDER — TETANUS-DIPHTH-ACELL PERTUSSIS 5-2.5-18.5 LF-MCG/0.5 IM SUSY: 0.5000 mL | PREFILLED_SYRINGE | Freq: Once | INTRAMUSCULAR | Status: DC

## 2021-11-25 MED ORDER — DIPHENHYDRAMINE HCL 50 MG/ML IJ SOLN: 12.5000 mg | INTRAMUSCULAR | Status: DC | PRN

## 2021-11-25 MED ORDER — ACETAMINOPHEN 325 MG PO TABS: 650.0000 mg | ORAL_TABLET | ORAL | Status: DC | PRN

## 2021-11-25 MED ORDER — ALBUTEROL SULFATE HFA 108 (90 BASE) MCG/ACT IN AERS
2.0000 | INHALATION_SPRAY | Freq: Four times a day (QID) | RESPIRATORY_TRACT | Status: DC | PRN
  Filled 2021-11-25: qty 6.7

## 2021-11-25 MED ORDER — LACTATED RINGERS IV SOLN: INTRAVENOUS | Status: DC

## 2021-11-25 MED ORDER — FENTANYL-BUPIVACAINE-NACL 0.5-0.125-0.9 MG/250ML-% EP SOLN: 12.0000 mL/h | EPIDURAL | Status: DC | PRN

## 2021-11-25 MED ORDER — OXYTOCIN BOLUS FROM INFUSION
333.0000 mL | Freq: Once | INTRAVENOUS | Status: AC
  Administered 2021-11-25: 333 mL via INTRAVENOUS

## 2021-11-25 MED ORDER — LIDOCAINE HCL (PF) 1 % IJ SOLN: 30.0000 mL | INTRAMUSCULAR | Status: DC | PRN

## 2021-11-25 MED ORDER — ONDANSETRON HCL 4 MG/2ML IJ SOLN: 4.0000 mg | INTRAMUSCULAR | Status: DC | PRN

## 2021-11-25 MED ORDER — BENZOCAINE-MENTHOL 20-0.5 % EX AERO: 1.0000 "application " | INHALATION_SPRAY | CUTANEOUS | Status: DC | PRN

## 2021-11-25 MED ORDER — SENNOSIDES-DOCUSATE SODIUM 8.6-50 MG PO TABS
2.0000 | ORAL_TABLET | Freq: Every day | ORAL | Status: DC
  Administered 2021-11-26: 2 via ORAL
  Filled 2021-11-25: qty 2

## 2021-11-25 NOTE — MAU Note (Addendum)
Pt reports to mau with c/o ctx q 10-15 min that started around 0830 this morning.  Denies LOF or vag bleeding, DFM since last night FHR 155 ?

## 2021-11-25 NOTE — Lactation Note (Signed)
This note was copied from a baby's chart. ?Lactation Consultation Note ? ?Patient Name: Alice Gibson ?Today's Date: 11/25/2021 ?Reason for consult: Initial assessment;Term ?Age:22 hours ?Per mom her feeding choice is"  breast and formula feeding. ?Per mom, she doesn't have any BF questions or concerns for LC at this time ?LC did not observe latch at this time, per mom, infant latched well in L&D and  infant was formula feed (her choice )at 1500 pm and again at 17 pm. ?LC suggested mom latch infant with every feeding to help stimulate and establish her milk supply and afterwards offer formula if she chooses.  ?Mom knows to breastfeed infant according to hunger cues, 8 to 12+ or more times within 24 hours, skin to skin. ?LC gave mom breastfeeding supplemental sheet. ?Mom made aware of O/P services, breastfeeding support groups, community resources, and our phone # for post-discharge questions.   ?Maternal Data ?Has patient been taught Hand Expression?: Yes ?Does the patient have breastfeeding experience prior to this delivery?: Yes ?How long did the patient breastfeed?: Per mom, she BF her 37 month old for 4 months ? ?Feeding ?Mother's Current Feeding Choice: Breast Milk and Formula ?Nipple Type: Slow - flow ? ?LATCH Score ?  ? ?  ? ?  ? ?  ? ?  ? ?  ? ? ?Lactation Tools Discussed/Used ?  ? ?Interventions ?Interventions: Breast feeding basics reviewed;Skin to skin;Breast compression;Education;Pace feeding;LC Services brochure ? ?Discharge ?  ? ?Consult Status ?Consult Status: Follow-up ?Date: 11/26/21 ?Follow-up type: In-patient ? ? ? ?Danelle Earthly ?11/25/2021, 6:48 PM ? ? ? ?

## 2021-11-25 NOTE — Procedures (Signed)
? ? ?  Postpartum Nexplanon Insertion ? ?Nexplanon Insertion Procedure ?Patient identified, informed consent performed, consent signed.  We discussed in detail regarding risks including infection, bleeding, injury to surrounding areas, device moving. Also discussed that could be less effective based on BMI of >30.  Patient expresses understanding and would like to proceed with insertion. Patient does understand that irregular bleeding is a very common side effect of this medication. She was advised to have backup contraception for one week after placement. Appropriate time out taken.   ? ?Patient's left arm was prepped and draped in the usual sterile fashion. The epicondyle was palpated and mark was made with cap of syringe at 8cm proximal and 2-3 cm below groove from epicondyle.  Patient was prepped with alcohol swab and then injected with 3 ml of 1% lidocaine.  She was prepped with betadine, Nexplanon removed from packaging,  Device confirmed in needle, then inserted full length of needle and withdrawn per handbook instructions. Nexplanon was able to palpated in the patient's arm; patient palpated the insert herself. There was minimal blood loss.  Patient insertion site covered with guaze and a pressure bandage to reduce any bruising.  The patient tolerated the procedure well and was given post procedure instructions.  ? ?Lot number: [id number] ?Exo [id number] ? ?Warner Mccreedy, MD, MPH ?OB Fellow, Faculty Practice ?Center for [employer], [facility] ? ? ? ? ?

## 2021-11-25 NOTE — H&P (Signed)
OBSTETRIC ADMISSION HISTORY AND PHYSICAL ? ?Alice Gibson is a 21 y.o. female [phone] with IUP at 7112w3d by  LMP/US presenting for spontaneous labor. She reports +FMs, No LOF, no VB, no blurry vision, headaches or peripheral edema, and RUQ pain.  She plans on breast feeding. She request Nexolanon for birth control. ?She received her prenatal care at  [facility]   ? ?Dating: By LMP --->  Estimated Date of Delivery: 11/29/21 ? ?Sono:   ?@[redacted]w[redacted]d , CWD, normal anatomy, cephalic presentation, fundal placental lie, 3103g, 61% EFW ? ? 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 Abnormal urination 04/26/2014  ? Allergic rhinitis due to cats 05/02/2016  ? Allergic to animal dander 05/02/2016  ? Equivocal reactivity to Cat and to Dog danders on skin-prick testing by Dr Willa RoughHicks (Allergist, 01/2008)  ? Allergy to mold 05/02/2016  ? Equivocal reactivity to mold on skin-prick testing by Dr Willa RoughHicks (Allergist, 01/2008)  ? Allergy to pollen 05/02/2016  ? Strong allergic response to skin-prick testing ( Dr Willa RoughHicks (Allergist) 01/2008) to tree pollens  ? 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 Asthma   ? Asthma, mild persistent 03/12/2007  ? Qualifier: Diagnosis of  By: Humberto SealsSaxon NP, Darl PikesSusan     ? ASTHMA, PERSISTENT 03/12/2007  ? Qualifier: Diagnosis of  By: Humberto SealsSaxon NP, Darl PikesSusan     ? Back pain 05/18/2013  ? CHILDHOOD OBESITY 10/27/2008  ? Qualifier: Diagnosis of  By: McDiarmid MD, Tawanna Coolerodd    ? Constipation 10/05/2013  ? DERMATITIS, ATOPIC 12/18/2007  ? Qualifier: Diagnosis of  By: McDiarmid MD, Tawanna Coolerodd    ? Disordered sleep 12/02/2016  ? Disordered sleep 12/02/2016  ? Eczema 11/28/2015  ? Exercise-induced asthma 05/02/2016  ? Expressive language disorder 07/30/2010  ? Qualifier: Diagnosis of  By: McDiarmid MD, Tawanna Coolerodd    ? 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 Qualifier: History of  By:  McDiarmid MD, Tawanna Coolerodd    ? History of atopic dermatitis 12/18/2007  ? History of atopic dermatitis as child.     ? History of atopic dermatitis 12/18/2007  ? History of atopic dermatitis as child.     ? History of major depression 04/27/2015  ? History of seizures as a child 08/22/2009  ? 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 Major depressive disorder in remission (HCC) 07/21/2015  ? Major depressive disorder in remission (HCC) 07/21/2015  ? MDD (major depressive disorder), recurrent episode, severe (HCC) 04/27/2015  ? Obesity 10/27/2008  ? Qualifier: Diagnosis of  By: McDiarmid MD, Tawanna Coolerodd    ? Otalgia of right ear 07/21/2015  ? Peanut allergy 10/05/2013  ? Scoliosis, adolescent acquired 09/11/2015  ? SEIZURE DISORDER, HX OF 04/02/2007  ? Qualifier: Diagnosis of  By: McDiarmid MD, Tawanna Coolerodd    ? Seizures (HCC)   ? Severe episode of recurrent major depressive disorder, without psychotic features (HCC)   ? Smoking 07/21/2015  ? Superficial acne vulgaris 05/02/2016  ? Syncope 07/21/2015  ? ? ?Past Surgical History: ?Past Surgical History:  ?Procedure Laterality Date  ? NO PAST SURGERIES    ? ? ?Obstetrical History: ?OB History   ? ? Gravida  ?4  ? Para  ?2  ? Term  ?1  ? Preterm  ?   ? AB  ?1  ? Living  ?  2  ?  ? ? SAB  ?1  ? IAB  ?   ? Ectopic  ?   ? Multiple  ?0  ? Live Births  ?2  ?   ?  ?  ? ? ?Social History ?Social History  ? ?Socioeconomic History  ? Marital status: Single  ?  Spouse name: Alysia Penna  ? Number of children: 2  ? Years of education: Not on file  ? Highest education level: Not on file  ?Occupational History  ? Not on file  ?Tobacco Use  ? Smoking status: Former  ?  Types: Cigarettes  ?  Quit date: 08/20/2016  ?  Years since quitting: 5.2  ? Smokeless tobacco: Never  ?Vaping Use  ? Vaping Use: Never used  ?Substance and Sexual Activity  ? Alcohol use: Not Currently  ?  Comment: not while preg  ? Drug use: Not Currently  ?  Types: Marijuana  ?  Comment: last use nov 2022  ? Sexual activity:  Yes  ?Other Topics Concern  ? Not on file  ?Social History Narrative  ?  Temporary court-appointed guardians, patient's sister, Alice Gibson, and patient's brother-in-law, Location manager.  ? Mother with severe medical and mental illnesses that have placed great stress on patient as child and adolescent.   ? Alice Gibson has been sexually active  ?   ? ?Social Determinants of Health  ? ?Financial Resource Strain: Not on file  ?Food Insecurity: Not on file  ?Transportation Needs: Not on file  ?Physical Activity: Not on file  ?Stress: Not on file  ?Social Connections: Not on file  ? ? ?Family History: ?Family History  ?Problem Relation Age of Onset  ? Alcohol abuse Mother 77  ? Drug abuse Mother 21  ? Mental illness Mother 66  ? Clotting disorder Mother 54  ?     Hypercoagulopathy undefined but causing large aortic thromboemboli  ? Kidney disease Father   ? Diabetes Maternal Grandmother   ? Colon cancer Neg Hx   ? Esophageal cancer Neg Hx   ? Pancreatic cancer Neg Hx   ? Stomach cancer Neg Hx   ? ? ?Allergies: ?Allergies  ?Allergen Reactions  ? Valproic Acid Other (See Comments)  ?  REACTION: essential tremor  ? ? ?Medications Prior to Admission  ?Medication Sig Dispense Refill Last Dose  ? Prenatal Vit-Fe Fumarate-FA (MULTIVITAMIN-PRENATAL) 27-0.8 MG TABS tablet Take 1 tablet by mouth daily at 12 noon.   11/25/2021  ? acetaminophen (TYLENOL) 325 MG tablet Take 2 tablets (650 mg total) by mouth every 4 (four) hours.   Unknown  ? albuterol (VENTOLIN HFA) 108 (90 Base) MCG/ACT inhaler Inhale 2 puffs into the lungs every 6 (six) hours as needed for wheezing or shortness of breath. 18 g 5 More than a month  ? cetirizine (ZYRTEC) 10 MG tablet Take 1 tablet (10 mg total) by mouth daily. 30 tablet 11 More than a month  ? triamcinolone cream (KENALOG) 0.1 % Apply 1 application topically 2 (two) times daily. 30 g 0 More than a month  ? ? ? ?Review of Systems  ? ?All systems reviewed and negative except as stated in HPI ? ?Blood pressure  122/78, pulse 77, temperature 98.2 ?F (36.8 ?C), temperature source Oral, resp. rate 17, height 4\' 11"  (1.499 m), weight 122.5 kg, SpO2 98 %, unknown if currently breastfeeding. ?General appearance: alert ?Lungs: clear to auscultation bilaterally ?Heart: regular rate and rhythm ?Abdomen: soft, non-tender; bowel sounds normal ?Extremities: Homans sign is negative,  no sign of DVT ?Presentation: cephalic ?Fetal monitoring Baseline: 140 bpm, Variability: Good {> 6 bpm), Accelerations: Reactive, and Decelerations: Absent ?Uterine activity every 2-3 min ?Dilation: 7 ?Effacement (%): 100 ?Station: -2 ?Exam by:: k fields, rn ? ? ?Prenatal labs: ?ABO, Rh: --/--/PENDING (05/14 1152) ?Antibody: PENDING (05/14 1152) ?Rubella: 2.60 (01/25 1008) ?RPR: Non Reactive (03/29 0855)  ?HBsAg: Negative (01/25 1008)  ?HIV: Non Reactive (03/29 0855)  ?GBS: Negative/-- (05/03 1416)  ?3 hr Glucola normal ?Genetic screening  not on file ?Anatomy US normal ? ?Prenatal Transfer Tool  ?Maternal Diabetes: No ?Genetic Screening: no results in chart ?Maternal Ultrasounds/Referrals: Normal ?Fetal Ultrasounds or other Referrals:  None ?Maternal Substance Abuse:  No ?Significant Maternal Medications:  None ?Significant Maternal Lab Results: Group B Strep positive ? ?Results for orders placed or performed during the hospital encounter of 11/25/21 (from the past 24 hour(s))  ?CBC  ? Collection Time: 11/25/21 11:30 AM  ?Result Value Ref Range  ? WBC 11.1 (H) 4.0 - 10.5 K/uL  ? RBC 4.26 3.87 - 5.11 MIL/uL  ? Hemoglobin 12.7 12.0 - 15.0 g/dL  ? HCT 36.4 36.0 - 46.0 %  ? MCV 85.4 80.0 - 100.0 fL  ? MCH 29.8 26.0 - 34.0 pg  ? MCHC 34.9 30.0 - 36.0 g/dL  ? RDW 13.4 11.5 - 15.5 %  ? Platelets 238 150 - 400 K/uL  ? nRBC 0.0 0.0 - 0.2 %  ?Type and screen [facility]  ? Collection Time: 11/25/21 11:52 AM  ?Result Value Ref Range  ? ABO/RH(D) PENDING   ? Antibody Screen PENDING   ? Sample Expiration    ?  11/28/2021,2359 ?Performed at [facility], [address], [place], [place] [address] ?  ? ? ?Patient Active Problem List  ? Diagnosis Date Noted  ? Normal labor 11/25/2021  ? Group B streptococcal bacteriuria 08/20/2021  ? Supervision of normal

## 2021-11-25 NOTE — Progress Notes (Signed)
Post Partum Day 1 ? ?Subjective: ?Doing well. No acute events overnight. Pain is controlled and bleeding is appropriate. She is eating, drinking, voiding, and ambulating without issue. She is formula feeding which is going well. She has no other concerns at this time. ? ?Objective: ?Blood pressure 102/61, pulse (!) 56, temperature 98.1 ?F (36.7 ?C), temperature source Oral, resp. rate 18, height 4\' 11"  (1.499 m), weight 122.5 kg, SpO2 100 %, unknown if currently breastfeeding. ? ?Physical Exam:  ?General: alert, cooperative, and no distress ?Lochia: appropriate ?Uterine Fundus: firm and below umbilicus  ?DVT Evaluation: no LE edema or calf tenderness to palpation  ? ?Recent Labs  ?  11/25/21 ?1130  ?HGB 12.7  ?HCT 36.4  ? ? ?Assessment/Plan: ?Alice Gibson is a 21 y.o. [phone] on PPD# 1 s/p SVD. ? ?Progressing well. Meeting postpartum milestones. VSS. Continue routine postpartum care. ? ?Feeding: Formula  ?Contraception: Nexplanon placed inpatient  ? ?Dispo: Plan for discharge on PPD#2.  ? ? LOS: 0 days  ? ?Genia Del, MD  ?11/25/2021, 10:05 PM  ? ? ?

## 2021-11-25 NOTE — Lactation Note (Signed)
This note was copied from a baby's chart. ?Lactation Consultation Note ? ?Patient Name: Alice Gibson ?Today's Date: 11/25/2021 ?Reason for consult: L&D Initial assessment;Term ?Age:22 hours ? ?L&D consult with <60 minutes old infant and P3 mother. Congratulated family on newborn.  ?Breast and formula feeding preference. Per mother, baby already latched well. Declines latching assistance at this time.  ? ?Discussed STS as ideal transition for infants after birth. Talked about primal reflexes. Explained LC services availability during postpartum stay. Thanked family for their time.   ?  ? ?Maternal Data ?Has patient been taught Hand Expression?: No ?Does the patient have breastfeeding experience prior to this delivery?: Yes ?How long did the patient breastfeed?: 4 months x1 ? ?Feeding ?Mother's Current Feeding Choice: Breast Milk and Formula ? ?Interventions ?Interventions: Skin to skin;Education ? ?Discharge ?Pump: Personal ?WIC Program: Yes ? ?Consult Status ?Consult Status: Follow-up from L&D ?Date: 11/25/21 ?Follow-up type: In-patient ? ? ? ?Ted Goodner A Higuera Ancidey ?11/25/2021, 1:15 PM ? ? ? ?

## 2021-11-25 NOTE — Discharge Summary (Signed)
? ?  Postpartum Discharge Summary ? ? ?Patient Name: Alice Gibson ?DOB: 2001-01-21 ?MRN: [id number] ? ?Date of admission: 11/25/2021 ?Delivery date:11/25/2021  ?Delivering provider: Renard Matter  ?Date of discharge: 11/27/2021 ? ?Admitting diagnosis: Normal labor [O80, Z37.9] ?Intrauterine pregnancy: [redacted]w[redacted]d     ?Secondary diagnosis:  Principal Problem: ?  Vaginal delivery ?Active Problems: ?  Morbid obesity ([place]) ?  Exercise-induced asthma ?  Supervision of normal pregnancy in second trimester ?  History of seizures as a child ?  Group B streptococcal bacteriuria ?  Normal labor ?  Encounter for initial prescription of implantable subdermal contraceptive ? ?Additional problems: None    ?Discharge diagnosis: Term Pregnancy Delivered                                              ?Post partum procedures: Nexplanon placed inpatient  ?Augmentation: N/A ?Complications: None ? ?Hospital course: Onset of Labor With Vaginal Delivery      ?21 y.o. yo [id number] at [redacted]w[redacted]d was admitted in Active Labor on 11/25/2021. Patient had an uncomplicated labor course as follows:  ?Membrane Rupture Time/Date: 12:12 PM ,11/25/2021   ?Delivery Method:Vaginal, Spontaneous  ?Episiotomy: None  ?Lacerations:  None  ?Patient had an uncomplicated postpartum course.  She is ambulating, tolerating a regular diet, passing flatus, and urinating well.  She had a Nexplanon placed while inpatient without complication for contraception.  Her pain and bleeding are controlled.  She is formula feeding well.  Patient is discharged home in stable condition on 11/27/21. ? ?Newborn Data: ?Birth date:11/25/2021  ?Birth time:12:15 PM  ?Gender:Female  ?Living status:Living  ?Apgars:9 ,9  ?Weight:3290 g  ? ?Magnesium Sulfate received: No ?BMZ received: No ?Rhophylac: N/A ?MMR: N/A ?T-DaP: Given prenatally ?Flu: Up to date ?Transfusion: No ? ?Physical exam  ?Vitals:  ? 11/26/21 0620 11/26/21 1447 11/26/21 1940 11/27/21 0542  ?BP: 117/79 129/70 134/80 115/71  ?Pulse: 77 (!) 58 60  60  ?Resp: $Remov'17 17 18 18  'QQZAVv$ ?Temp: 98.2 ?F (36.8 ?C) 98.1 ?F (36.7 ?C) 98.9 ?F (37.2 ?C) 97.8 ?F (36.6 ?C)  ?TempSrc:  Oral Oral Oral  ?SpO2: 99% 100% 100% 100%  ?Weight:      ?Height:      ? ?General: alert, cooperative, and no distress ?Lochia: appropriate ?Uterine Fundus: firm and below umbilicus  ?DVT Evaluation: no LE edema or calf tenderness to palpation  ? ?Labs: ?Lab Results  ?Component Value Date  ? WBC 11.2 (H) 11/26/2021  ? HGB 11.3 (L) 11/26/2021  ? HCT 33.9 (L) 11/26/2021  ? MCV 86.9 11/26/2021  ? PLT 209 11/26/2021  ? ? ?  Latest Ref Rng & Units 11/14/2021  ?  4:15 PM  ?CMP  ?Glucose 70 - 99 mg/dL 81    ?BUN 6 - 20 mg/dL 6    ?Creatinine 0.57 - 1.00 mg/dL 0.58    ?Sodium 134 - 144 mmol/L 136    ?Potassium 3.5 - 5.2 mmol/L 4.3    ?Chloride 96 - 106 mmol/L 105    ?CO2 20 - 29 mmol/L 19    ?Calcium 8.7 - 10.2 mg/dL 8.5    ?Total Protein 6.0 - 8.5 g/dL 5.9    ?Total Bilirubin 0.0 - 1.2 mg/dL 0.5    ?Alkaline Phos 44 - 121 IU/L 159    ?AST 0 - 40 IU/L 16    ?ALT 0 - 32 IU/L  18    ? ?Edinburgh Score: ? ?  11/26/2021  ?  9:41 PM  ?Edinburgh Postnatal Depression Scale Screening Tool  ?I have been able to laugh and see the funny side of things. 0  ?I have looked forward with enjoyment to things. 0  ?I have blamed myself unnecessarily when things went wrong. 0  ?I have been anxious or worried for no good reason. 0  ?I have felt scared or panicky for no good reason. 0  ?Things have been getting on top of me. 0  ?I have been so unhappy that I have had difficulty sleeping. 0  ?I have felt sad or miserable. 0  ?I have been so unhappy that I have been crying. 0  ?The thought of harming myself has occurred to me. 0  ?Edinburgh Postnatal Depression Scale Total 0  ? ? ? ?After visit meds:  ?Allergies as of 11/27/2021   ? ?   Reactions  ? Valproic Acid Other (See Comments)  ? REACTION: essential tremor  ? ?  ? ?  ?Medication List  ?  ? ?TAKE these medications   ? ?acetaminophen 500 MG tablet ?Commonly known as: TYLENOL ?Take 2  tablets (1,000 mg total) by mouth every 8 (eight) hours as needed (pain). ?What changed:  ?medication strength ?how much to take ?when to take this ?reasons to take this ?  ?albuterol 108 (90 Base) MCG/ACT inhaler ?Commonly known as: VENTOLIN HFA ?Inhale 2 puffs into the lungs every 6 (six) hours as needed for wheezing or shortness of breath. ?  ?cetirizine 10 MG tablet ?Commonly known as: ZYRTEC ?Take 1 tablet (10 mg total) by mouth daily. ?  ?ibuprofen 600 MG tablet ?Commonly known as: ADVIL ?Take 1 tablet (600 mg total) by mouth every 6 (six) hours as needed (pain). ?  ?multivitamin-prenatal 27-0.8 MG Tabs tablet ?Take 1 tablet by mouth daily at 12 noon. ?  ?triamcinolone cream 0.1 % ?Commonly known as: KENALOG ?Apply 1 application topically 2 (two) times daily. ?  ? ?  ? ? ? ?Discharge home in stable condition ?Infant Feeding: Bottle ?Infant Disposition: home with mother ?Discharge instruction: per After Visit Summary and Postpartum booklet. ?Activity: Advance as tolerated. Pelvic rest for 6 weeks.  ?Diet: routine diet ?Future Appointments: ?No future appointments. ? ?Follow up Visit: ?Message sent to [facility] by Dr. Cy Blamer on 5/14 ? ?Please schedule this patient for a In person postpartum visit in 4 weeks with the following provider: Any provider. ?Additional Postpartum F/U: None   ?Low risk pregnancy complicated by:  None ?Delivery mode:  Vaginal, Spontaneous  ?Anticipated Birth Control:  PP Nexplanon placed ? ?11/27/2021 ?Genia Del, MD ? ? ? ?

## 2021-11-26 LAB — CBC
HCT: 33.9 % — ABNORMAL LOW (ref 36.0–46.0)
Hemoglobin: 11.3 g/dL — ABNORMAL LOW (ref 12.0–15.0)
MCH: 29 pg (ref 26.0–34.0)
MCHC: 33.3 g/dL (ref 30.0–36.0)
MCV: 86.9 fL (ref 80.0–100.0)
Platelets: 209 10*3/uL (ref 150–400)
RBC: 3.9 MIL/uL (ref 3.87–5.11)
RDW: 13.6 % (ref 11.5–15.5)
WBC: 11.2 10*3/uL — ABNORMAL HIGH (ref 4.0–10.5)
nRBC: 0 % (ref 0.0–0.2)

## 2021-11-26 LAB — RPR: RPR Ser Ql: NONREACTIVE

## 2021-11-26 NOTE — Lactation Note (Signed)
This note was copied from a baby's chart. ?Lactation Consultation Note ? ?Patient Name: Alice Gibson ?Today's Date: 11/26/2021 ?  ?Age:22 hours ?LC entered the room, per mom, she has decided to formula feed only.  ?Maternal Data ?  ? ?Feeding ?  ? ?LATCH Score ?  ? ?  ? ?  ? ?  ? ?  ? ?  ? ? ?Lactation Tools Discussed/Used ?  ? ?Interventions ?  ? ?Discharge ?  ? ?Consult Status ?  ? ? ? ?Danelle Earthly ?11/26/2021, 3:31 PM ? ? ? ?

## 2021-11-26 NOTE — Social Work (Addendum)
CSW received consult for hx of marijuana use.  Referral was screened out due to the following: ? ?~MOB had no documented substance use after initial prenatal visit/+UPT. ?~MOB had no positive drug screens after initial prenatal visit/+UPT. ?-Infant's UDS negative.  ? ?Per OB notes at the initial prenatal visit (07/24/21), It is noted "Other substance use:  Yes previously smoked marijuana but not during pregnancy." ? ?Please consult CSW if current concerns arise or by MOB's request. ? ?CSW will monitor  the infant's UDS/ CDS results and make a report to CPS, if warrented.  ? ?Vivi Barrack, MSW, LCSW ?[facility]  ?Clinical Social Worker  ?[phone] ?11/26/2021  10:23 AM  ?

## 2021-11-26 NOTE — Clinical Social Work Maternal (Addendum)
?CLINICAL SOCIAL WORK MATERNAL/CHILD NOTE ? ?Patient Details  ?Name: Alice Gibson ?MRN: [id number] ?Date of Birth: 07/09/2001 ? ?Date:  February 19, 2022 ? ?Clinical Social Worker Initiating Note:  Kathrin Greathouse, LCSW Date/Time: Initiated:  11/26/21/1250    ? ?Child's Name:  Loreen Bankson  ? ?Biological Parents:  Mother, Father (MOB: Dorothea Ogle FOB: Kerrin Mo)  ? ?Need for Interpreter:  None  ? ?Reason for Referral:  Behavioral Health Concerns, Current Substance Use/Substance Use During Pregnancy    ? ?Address:  [address] ?[place] [address]  ?  ?Phone number:  [phone] (home)    ? ?Additional phone number:  ? ?Household Members/Support Persons (HM/SP):   Household Member/Support Person 1, Household Member/Support Person 2, Household Member/Support Person 3 ? ? ?HM/SP Name Relationship DOB or Age  ?HM/SP -1 Piper Langley Gauss Daughter 09-10-2016  ?HM/SP -2 Rhoderick Moody Son 01-05-2021  ?HM/SP -[place] Significant Other 10-21-1995  ?HM/SP -4        ?HM/SP -5        ?HM/SP -6        ?HM/SP -7        ?HM/SP -8        ? ? ?Natural Supports (not living in the home):  Immediate Family  ? ?Professional Supports: None  ? ?Employment: Full-time  ? ?Type of Work: Archivist  ? ?Education:  High school graduate  ? ?Homebound arranged:   ? ?Financial Resources:  Medicaid  ? ?Other Resources:  Physicist, medical  , [place]  ? ?Cultural/Religious Considerations Which May Impact Care:   ? ?Strengths:  Ability to meet basic needs  , Home prepared for child  , Pediatrician chosen  ? ?Psychotropic Medications:        ? ?Pediatrician:    [place] area ? ?Pediatrician List:  ? ?[place]  ?[place]    ?[facility]    ?[facility]    ?[facility]    ?[facility]    ? ? ?Pediatrician Fax Number:   ? ?Risk Factors/Current Problems:     ? ?Cognitive State:  Able to Concentrate  , Alert  , Linear Thinking  , Insightful    ? ?Mood/Affect:  Calm  , Bright   , Interested    ? ?CSW Assessment: CSW received consult for hx of Anxiety, Depression and THC use.  CSW met with MOB to offer support and complete assessment.   ? ?CSW met with MOB at bedside and observed MOB sitting on couch watching infant resting on a Boppy pillow next to her and FOB was present. MOB gave CSW permission to share all information in front of FOB.MOB presented pleasant and welcomed CSW visit. CSW inquired how MOB has felt since giving birth. MOB expressed feeling good and shared the L&D went good. MOB reported that she felt good emotionally during the pregnancy. MOB acknowledged that she was diagnosed with depression and anxiety in 2015 "because my mom had me committed to the hospital for something that I did." MOB expressed that she does not have a relationship with her mom and discussed how she no longer feels stressed. MOB reported having a normal range of emotions with no concerns for depression and anxiety. MOB expressed she copes well by listening to music and finds comfort in venting to her sisters. CSW acknowledged MOB efforts. MOB identified her sisters and fianc? as supports. CSW discussed PPD. MOB reported no history of PPD.  ? ?CSW  provided education regarding the baby blues period vs. perinatal mood disorders, discussed treatment and gave resources for mental health follow up if concerns arise.  CSW recommended MOB complete a self-evaluation during the postpartum time period using the New Mom Checklist from Postpartum Progress and encouraged MOB to contact a medical professional if symptoms are noted at any time. CSW assessed MOB for safety. MOB denied thoughts of harm to self and others.  ? ?CSW inquired about MOB THC use during the pregnancy. MOB reported that she used [facility] in November 2022 for carpel tunnel pain and quit when she learned about the pregnancy at about 18 weeks. MOB denied using THC any other time during the pregnancy. MOB reported her initial OB appointment was in  January 2023.  CSW informed MOB about the hospital drug screen policy. MOB made aware that CSW monitor the infant's CDS and make a report to CPS, if warranted. MOB reported understanding. MOB denied CPS history or involvement.  ? ?MOB reported she has items for the infant including a bassinet where the infant will sleep. CSW provided review of Sudden Infant Death Syndrome (SIDS) precautions. MOB has chosen [facility] for the infant's follow up care. MOB reported she receives [facility] and Supplemental Nutrition Assistance and will notify the agencies about the birth. CSW assessed MOB for additional needs. MOB reported no further need.  ? ? ?CSW Plan/Description:  Sudden Infant Death Syndrome (SIDS) Education, CSW Will Continue to Monitor Umbilical Cord Tissue Drug Screen Results and Make Report if Warranted, [place], Perinatal Mood and Anxiety Disorder (PMADs) Education, No Further Intervention Required/No Barriers to Discharge  ? ? ?Lia Hopping, LCSW ?2022/07/08, 1:32 PM ? ?

## 2021-11-27 ENCOUNTER — Other Ambulatory Visit: Payer: Medicaid Other

## 2021-11-27 MED ORDER — ACETAMINOPHEN 500 MG PO TABS: 1000.0000 mg | ORAL_TABLET | Freq: Three times a day (TID) | ORAL | 0 refills | Status: AC | PRN

## 2021-11-27 MED ORDER — IBUPROFEN 600 MG PO TABS: 600.0000 mg | ORAL_TABLET | Freq: Four times a day (QID) | ORAL | 0 refills | Status: DC | PRN

## 2021-11-28 ENCOUNTER — Encounter: Payer: Medicaid Other | Admitting: Family Medicine

## 2021-11-28 ENCOUNTER — Encounter: Payer: Self-pay | Admitting: Family Medicine

## 2021-12-03 ENCOUNTER — Other Ambulatory Visit: Payer: Medicaid Other

## 2021-12-03 ENCOUNTER — Telehealth (HOSPITAL_COMMUNITY): Payer: Self-pay | Admitting: *Deleted

## 2021-12-03 NOTE — Telephone Encounter (Signed)
Patient voiced no questions or concerns regarding her health at this time. EPDS not completed at this time per patient request. Patient requested RN call back later this evening to complete EPDS. Patient voiced no questions or concerns regarding infant at this time. Patient reports infant sleeps in a bassinet on her back. RN reviewed ABCs of safe sleep. Patient verbalized understanding. Deforest Hoyles, RN, 12/03/21, [phone]

## 2021-12-03 NOTE — Telephone Encounter (Signed)
Return call placed to patient to complete EPDS as requested by patient earlier today. RN left message requesting patient return RN call. Deforest Hoyles, RN, 12/03/21, [phone]

## 2021-12-06 ENCOUNTER — Inpatient Hospital Stay (HOSPITAL_COMMUNITY): Payer: Medicaid Other

## 2021-12-06 ENCOUNTER — Inpatient Hospital Stay (HOSPITAL_COMMUNITY): Admission: AD | Admit: 2021-12-06 | Payer: Medicaid Other | Source: Home / Self Care | Admitting: Family Medicine

## 2021-12-18 ENCOUNTER — Encounter: Payer: Self-pay | Admitting: *Deleted

## 2021-12-24 ENCOUNTER — Ambulatory Visit
Admission: EM | Admit: 2021-12-24 | Discharge: 2021-12-24 | Disposition: A | Discharge status: Home / Self Care | Payer: Medicaid Other | Attending: Internal Medicine | Admitting: Internal Medicine

## 2021-12-24 DIAGNOSIS — J069 Acute upper respiratory infection, unspecified: Secondary | ICD-10-CM | POA: Diagnosis not present

## 2021-12-24 DIAGNOSIS — J4521 Mild intermittent asthma with (acute) exacerbation: Secondary | ICD-10-CM

## 2021-12-24 DIAGNOSIS — R062 Wheezing: Secondary | ICD-10-CM

## 2021-12-24 MED ORDER — BENZONATATE 100 MG PO CAPS: 100.0000 mg | ORAL_CAPSULE | Freq: Three times a day (TID) | ORAL | 0 refills | Status: DC | PRN

## 2021-12-24 MED ORDER — PREDNISONE 20 MG PO TABS: 40.0000 mg | ORAL_TABLET | Freq: Every day | ORAL | 0 refills | Status: AC

## 2021-12-24 MED ORDER — ALBUTEROL SULFATE HFA 108 (90 BASE) MCG/ACT IN AERS: 2.0000 | INHALATION_SPRAY | Freq: Four times a day (QID) | RESPIRATORY_TRACT | 5 refills | Status: DC | PRN

## 2021-12-24 NOTE — Discharge Instructions (Signed)
You have a viral upper respiratory infection that is causing your asthma to flareup.  Your albuterol inhaler has been refilled for you to take as needed.  Prednisone steroid has been prescribed to help alleviate inflammation and wheezing.  COVID test is pending.  We will call if it is positive.  Please follow-up if symptoms persist or worsen.

## 2021-12-24 NOTE — ED Provider Notes (Signed)
[name]    CSN: [id number] Arrival date & time: 12/24/21  0808      History   Chief Complaint Chief Complaint  Patient presents with   chest congestion    HPI Alice Gibson is a 21 y.o. female.   Patient presents with complaints of chest congestion, shortness of breath, cough, runny nose, sore throat that started yesterday.  Shortness of breath is constant.  Patient denies any chest pain.  Patient does report that she has history of asthma and tried to use her albuterol inhaler but it was expired.  Patient is also taking Alka-Seltzer cold and flu with minimal improvement in symptoms.  Denies chest pain, ear pain, nausea, vomiting, diarrhea, abdominal pain.  Denies any known sick contacts or fever.     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   Asthma    Asthma, mild persistent 03/12/2007   Qualifier: Diagnosis of  By: Zebedee Iba NP, Manuela Schwartz      ASTHMA, PERSISTENT 03/12/2007   Qualifier: Diagnosis of  By: Zebedee Iba NP, Susan      Back pain 05/18/2013   CHILDHOOD OBESITY 10/27/2008   Qualifier: Diagnosis of  By: McDiarmid MD, Todd     Constipation 10/05/2013   DERMATITIS, ATOPIC 12/18/2007   Qualifier: Diagnosis of  By: McDiarmid MD, Todd     Disordered sleep 12/02/2016   Disordered sleep 12/02/2016   Eczema 11/28/2015   Exercise-induced asthma 05/02/2016   Expressive language disorder 07/30/2010   Qualifier: Diagnosis of  By: McDiarmid MD, Kipp Brood MAL SEIZURE 08/22/2009    Qualifier: History of  By: McDiarmid MD, Todd     History of atopic dermatitis 12/18/2007   History of atopic dermatitis as child.      History of atopic dermatitis 12/18/2007   History of atopic dermatitis as child.      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)     Major depressive disorder in remission ([place]) 07/21/2015   Major depressive disorder in remission ([place]) 07/21/2015   MDD (major depressive disorder), recurrent episode, severe ([place]) 04/27/2015   Obesity 10/27/2008   Qualifier: Diagnosis of  By: McDiarmid MD, Ether Griffins of right ear 07/21/2015   Peanut allergy 10/05/2013   Scoliosis, adolescent acquired 09/11/2015   SEIZURE DISORDER, HX OF 04/02/2007   Qualifier: Diagnosis of  By: McDiarmid MD, Todd     Seizures ([place])    Severe episode of recurrent major depressive disorder, without psychotic features ([place])    Smoking 07/21/2015   Superficial acne vulgaris 05/02/2016   Syncope 07/21/2015    Patient Active Problem List   Diagnosis Date Noted   Normal labor 11/25/2021   Vaginal delivery 11/25/2021   Encounter for initial prescription of implantable subdermal contraceptive    Group B streptococcal bacteriuria 08/20/2021   Supervision of normal pregnancy  in second trimester 07/24/2021   Short interval between pregnancies affecting pregnancy in second trimester, antepartum 07/24/2021   History of seizures as a child 07/24/2021   Previous baby with fetal growth restriction 07/24/2021   Atopic dermatitis 05/02/2016   Exercise-induced asthma 05/02/2016   Morbid obesity ([place]) 10/27/2008    Past Surgical History:  Procedure Laterality Date   NO PAST SURGERIES      OB History     Gravida  4   Para  3   Term  2   Preterm      AB  1   Living  3      SAB  1   IAB      Ectopic      Multiple  0   Live Births  3            Home  Medications    Prior to Admission medications   Medication Sig Start Date End Date Taking? Authorizing Provider  benzonatate (TESSALON) 100 MG capsule Take 1 capsule (100 mg total) by mouth every 8 (eight) hours as needed for cough. 12/24/21  Yes Mar Zettler, Hildred Alamin E, FNP  predniSONE (DELTASONE) 20 MG tablet Take 2 tablets (40 mg total) by mouth daily for 5 days. 12/24/21 12/29/21 Yes Ishitha Roper, Michele Rockers, FNP  acetaminophen (TYLENOL) 500 MG tablet Take 2 tablets (1,000 mg total) by mouth every 8 (eight) hours as needed (pain). 11/27/21   Genia Del, MD  albuterol (VENTOLIN HFA) 108 (90 Base) MCG/ACT inhaler Inhale 2 puffs into the lungs every 6 (six) hours as needed for wheezing or shortness of breath. 12/24/21   Teodora Medici, FNP  cetirizine (ZYRTEC) 10 MG tablet Take 1 tablet (10 mg total) by mouth daily. 10/25/19   McDiarmid, Blane Ohara, MD  ibuprofen (ADVIL) 600 MG tablet Take 1 tablet (600 mg total) by mouth every 6 (six) hours as needed (pain). 11/27/21   Genia Del, MD  Prenatal Vit-Fe Fumarate-FA (MULTIVITAMIN-PRENATAL) 27-0.8 MG TABS tablet Take 1 tablet by mouth daily at 12 noon.    [provider]  triamcinolone cream (KENALOG) 0.1 % Apply 1 application topically 2 (two) times daily. 05/03/21   McDiarmid, Blane Ohara, MD    Family History Family History  Problem Relation Age of Onset   Alcohol abuse Mother 24   Drug abuse Mother 9   Mental illness Mother 56   Clotting disorder Mother 22       Hypercoagulopathy undefined but causing large aortic thromboemboli   Kidney disease Father    Diabetes Maternal Grandmother    Colon cancer Neg Hx    Esophageal cancer Neg Hx    Pancreatic cancer Neg Hx    Stomach cancer Neg Hx     Social History Social History   Tobacco Use   Smoking status: Former    Types: Cigarettes    Quit date: 08/20/2016    Years since quitting: 5.3   Smokeless tobacco: Never  Vaping Use   Vaping Use: Never used  Substance Use Topics   Alcohol use:  Not Currently    Comment: not while preg   Drug use: Not Currently    Types: Marijuana    Comment: last use nov 2022     Allergies   Valproic acid   Review of Systems Review of Systems Per HPI  Physical Exam Triage Vital Signs ED Triage Vitals  Enc Vitals Group     BP 12/24/21 0832 109/66     Pulse  Rate 12/24/21 0832 75     Resp 12/24/21 0832 20     Temp 12/24/21 0832 98.3 F (36.8 C)     Temp Source 12/24/21 0832 Oral     SpO2 12/24/21 0832 97 %     Weight --      Height --      Head Circumference --      Peak Flow --      Pain Score 12/24/21 0837 0     Pain Loc --      Pain Edu? --      Excl. in [place]? --    No data found.  Updated Vital Signs BP 109/66 (BP Location: Left Arm)   Pulse 75   Temp 98.3 F (36.8 C) (Oral)   Resp 20   SpO2 97%   Breastfeeding No   Visual Acuity Right Eye Distance:   Left Eye Distance:   Bilateral Distance:    Right Eye Near:   Left Eye Near:    Bilateral Near:     Physical Exam Constitutional:      General: She is not in acute distress.    Appearance: Normal appearance. She is not toxic-appearing or diaphoretic.  HENT:     Head: Normocephalic and atraumatic.     Right Ear: Tympanic membrane and ear canal normal.     Left Ear: Tympanic membrane and ear canal normal.     Nose: Congestion present.     Mouth/Throat:     Mouth: Mucous membranes are moist.     Pharynx: No posterior oropharyngeal erythema.  Eyes:     Extraocular Movements: Extraocular movements intact.     Conjunctiva/sclera: Conjunctivae normal.     Pupils: Pupils are equal, round, and reactive to light.  Cardiovascular:     Rate and Rhythm: Normal rate and regular rhythm.     Pulses: Normal pulses.     Heart sounds: Normal heart sounds.  Pulmonary:     Effort: Pulmonary effort is normal. No respiratory distress.     Breath sounds: No stridor. Wheezing present. No rhonchi or rales.     Comments: Mild wheezing noted on exam.  Abdominal:     General:  Abdomen is flat. Bowel sounds are normal.     Palpations: Abdomen is soft.  Musculoskeletal:        General: Normal range of motion.     Cervical back: Normal range of motion.  Skin:    General: Skin is warm and dry.  Neurological:     General: No focal deficit present.     Mental Status: She is alert and oriented to person, place, and time. Mental status is at baseline.  Psychiatric:        Mood and Affect: Mood normal.        Behavior: Behavior normal.      UC Treatments / Results  Labs (all labs ordered are listed, but only abnormal results are displayed) Labs Reviewed  NOVEL CORONAVIRUS, NAA    EKG   Radiology No results found.  Procedures Procedures (including critical care time)  Medications Ordered in UC Medications - No data to display  Initial Impression / Assessment and Plan / UC Course  I have reviewed the triage vital signs and the nursing notes.  Pertinent labs & imaging results that were available during my care of the patient were reviewed by me and considered in my medical decision making (see chart for details).     Patient presents with symptoms  likely from a viral upper respiratory infection. Differential includes bacterial pneumonia, sinusitis, allergic rhinitis, COVID-19, flu. Patient is nontoxic appearing and not in need of emergent medical intervention.  Do not think that strep testing is necessary given appearance of throat on exam.  COVID test is pending.  Do not think that chest imaging is necessary given lung sounds.  Patient does have wheezing on exam and suspect asthma exacerbation in the setting of acute viral illness.  Will treat with prednisone steroid to decrease inflammation associated with wheezing and asthma exacerbation.  Patient does have history of seizures in childhood but reports that she does not take antiseizure medication.  She has taken prednisone multiple times before and has tolerated well with no seizures.  Therefore, I do  think that a low-dose and short course will be safe.  Albuterol inhaler refilled.  Return if symptoms fail to improve. Discussed strict ER precautions. Patient states understanding and is agreeable.  Discharged with PCP followup.  Final Clinical Impressions(s) / UC Diagnoses   Final diagnoses:  Viral upper respiratory tract infection with cough  Mild intermittent asthma with acute exacerbation  Wheezing     Discharge Instructions      You have a viral upper respiratory infection that is causing your asthma to flareup.  Your albuterol inhaler has been refilled for you to take as needed.  Prednisone steroid has been prescribed to help alleviate inflammation and wheezing.  COVID test is pending.  We will call if it is positive.  Please follow-up if symptoms persist or worsen.    ED Prescriptions     Medication Sig Dispense Auth. Provider   albuterol (VENTOLIN HFA) 108 (90 Base) MCG/ACT inhaler Inhale 2 puffs into the lungs every 6 (six) hours as needed for wheezing or shortness of breath. 18 g Oswaldo Conroy E, [place]   predniSONE (DELTASONE) 20 MG tablet Take 2 tablets (40 mg total) by mouth daily for 5 days. 10 tablet [place], [place] E, [place]   benzonatate (TESSALON) 100 MG capsule Take 1 capsule (100 mg total) by mouth every 8 (eight) hours as needed for cough. 21 capsule [place], Michele Rockers, [place]      PDMP not reviewed this encounter.   Teodora Medici, [place] 12/24/21 [phone]

## 2021-12-24 NOTE — ED Triage Notes (Signed)
Patient presents to Urgent Care with complaints of chest congestion, sob, cough and sore throat since yesterday. Patient reports taking otc cold and flu medications and expired inhaler.

## 2021-12-25 LAB — NOVEL CORONAVIRUS, NAA: SARS-CoV-2, NAA: NOT DETECTED

## 2021-12-27 ENCOUNTER — Ambulatory Visit (INDEPENDENT_AMBULATORY_CARE_PROVIDER_SITE_OTHER): Payer: Medicaid Other | Admitting: Family Medicine

## 2021-12-27 ENCOUNTER — Encounter: Payer: Self-pay | Admitting: Family Medicine

## 2021-12-27 DIAGNOSIS — J309 Allergic rhinitis, unspecified: Secondary | ICD-10-CM

## 2021-12-27 LAB — POCT GLYCOSYLATED HEMOGLOBIN (HGB A1C): Hemoglobin A1C: 5.3 % (ref 4.0–5.6)

## 2021-12-27 MED ORDER — CETIRIZINE HCL 10 MG PO TABS: 10.0000 mg | ORAL_TABLET | Freq: Every day | ORAL | 11 refills | Status: AC

## 2021-12-27 NOTE — Patient Instructions (Signed)
We sent in a refill of the cetirizine with years refill.   We are checking your sugar today.

## 2021-12-28 ENCOUNTER — Encounter: Payer: Self-pay | Admitting: Family Medicine

## 2021-12-28 NOTE — Progress Notes (Signed)
Subjective:     Alice Gibson is a 21 y.o. female who presents for a postpartum visit. She is 4 weeks postpartum following a spontaneous vaginal delivery. I have fully reviewed the prenatal and intrapartum course. The delivery was at 39 2/7 gestational weeks. Outcome: spontaneous vaginal delivery.  Postpartum course has been unremarkable. Baby's course has been unremarkable. Baby is feeding by bottle Rush Barer . Bleeding no bleeding. Bowel function is normal. Bladder function is normal. Patient is sexually active. Contraception method is Nexplanon. Postpartum depression screening: negative.  The following portions of the patient's history were reviewed and updated as appropriate: allergies, current medications, past family history, past medical history, past social history, past surgical history, and problem list.  Review UC visit record: (+) rhinorrhea and postnasal drip - seen UC 12/24/21 for cough SHOB, chest congestion, runny nose. Exam with wheeze noted. Diagnosis with URI with asthma exacerbation.  Prescription prednisone, refilled albuterol MDI Review of Systems Pertinent items are noted in HPI.   Objective:    BP 124/60   Pulse 75   Ht 4\' 11"  (1.499 m)   Wt 249 lb 4 oz (113.1 kg)   LMP 12/25/2021   SpO2 97%   BMI 50.34 kg/m   General:  alert and cooperative no acute disease, euthymic, bottle feeding baby girl        Assessment:  Patient had satisfactory Pap 08/08/21 negative for intraepithelial lesions or maligancy  Post partum state without complications Mild Asthma exacerbation - resolved.   Allergic Rhinitis [facility]one [facility] currently day 3 of 5. Improving.  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 Contraception: New Nexplanon is in place 2. Allergic Rhinitis and asthma: Refills cetirizine, complete prednisone pulse therapy 3. Eczema: start using triamcinolone cream at itching ketratosis 4. Follow up in:  as needed.

## 2021-12-31 ENCOUNTER — Ambulatory Visit
Admission: EM | Admit: 2021-12-31 | Discharge: 2021-12-31 | Disposition: A | Discharge status: Home / Self Care | Payer: Medicaid Other | Attending: Internal Medicine | Admitting: Internal Medicine

## 2021-12-31 DIAGNOSIS — H6592 Unspecified nonsuppurative otitis media, left ear: Secondary | ICD-10-CM | POA: Diagnosis not present

## 2021-12-31 MED ORDER — AMOXICILLIN 500 MG PO CAPS
500.0000 mg | ORAL_CAPSULE | Freq: Three times a day (TID) | ORAL | 0 refills | Status: DC
Start: 2021-12-31 — End: 2022-10-03

## 2021-12-31 NOTE — ED Triage Notes (Signed)
Pt presents with left side ear pain since yesterday.

## 2021-12-31 NOTE — ED Provider Notes (Signed)
[name]    CSN: [id number] Arrival date & time: 12/31/21  0834      History   Chief Complaint Chief Complaint  Patient presents with   Otalgia    HPI Alice Gibson is a 21 y.o. female comes to the patient care with 1 day history of left ear pain.  Symptoms started fairly abruptly and has been persistent.  No fever or chills.  No cough or sputum production.  Patient has a history of seasonal allergies and was recently treated for upper respiratory infection with asthma exacerbation.  Patient endorses postnasal drainage.  No shortness of breath or wheezing.  No discharge from the left ear.  Nasal discharge is greenish.  No nausea, vomiting or diarrhea. 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   Asthma    Asthma, mild persistent 03/12/2007   Qualifier: Diagnosis of  By: Humberto Seals NP, Darl Pikes      ASTHMA, PERSISTENT 03/12/2007   Qualifier: Diagnosis of  By: Humberto Seals NP, Susan      Back pain 05/18/2013   CHILDHOOD OBESITY 10/27/2008   Qualifier: Diagnosis of  By: McDiarmid MD, Todd     Constipation 10/05/2013   DERMATITIS, ATOPIC 12/18/2007   Qualifier: Diagnosis of  By: McDiarmid MD, Todd     Disordered sleep 12/02/2016   Disordered sleep 12/02/2016   Eczema 11/28/2015   Exercise-induced asthma 05/02/2016   Expressive language disorder 07/30/2010   Qualifier: Diagnosis of  By: McDiarmid MD, Dolores Patty MAL SEIZURE 08/22/2009   Qualifier: History of  By: McDiarmid MD, Todd      History of atopic dermatitis 12/18/2007   History of atopic dermatitis as child.      History of atopic dermatitis 12/18/2007   History of atopic dermatitis as child.      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)     Major depressive disorder in remission (HCC) 07/21/2015   Major depressive disorder in remission (HCC) 07/21/2015   MDD (major depressive disorder), recurrent episode, severe (HCC) 04/27/2015   Obesity 10/27/2008   Qualifier: Diagnosis of  By: McDiarmid MD, Clifton James of right ear 07/21/2015   Peanut allergy 10/05/2013   Scoliosis, adolescent acquired 09/11/2015   SEIZURE DISORDER, HX OF 04/02/2007   Qualifier: Diagnosis of  By: McDiarmid MD, Todd     Seizures (HCC)    Severe episode of recurrent major depressive disorder, without psychotic features (HCC)    Smoking 07/21/2015   Superficial acne vulgaris 05/02/2016   Syncope 07/21/2015    Patient Active Problem List   Diagnosis Date Noted   Normal labor 11/25/2021   Vaginal delivery 11/25/2021   Encounter for initial prescription of implantable subdermal contraceptive    Group B streptococcal bacteriuria 08/20/2021   Supervision of normal pregnancy in second trimester 07/24/2021   Short  interval between pregnancies affecting pregnancy in second trimester, antepartum 07/24/2021   History of seizures as a child 07/24/2021   Previous baby with fetal growth restriction 07/24/2021   Atopic dermatitis 05/02/2016   Exercise-induced asthma 05/02/2016   Morbid obesity (HCC) 10/27/2008    Past Surgical History:  Procedure Laterality Date   NO PAST SURGERIES      OB History     Gravida  4   Para  3   Term  2   Preterm      AB  1   Living  3      SAB  1   IAB      Ectopic      Multiple  0   Live Births  3            Home Medications    Prior to Admission medications    Medication Sig Start Date End Date Taking? Authorizing Provider  amoxicillin (AMOXIL) 500 MG capsule Take 1 capsule (500 mg total) by mouth 3 (three) times daily. 12/31/21  Yes Lyrical Sowle, Britta Mccreedy, MD  acetaminophen (TYLENOL) 500 MG tablet Take 2 tablets (1,000 mg total) by mouth every 8 (eight) hours as needed (pain). 11/27/21   Worthy Rancher, MD  albuterol (VENTOLIN HFA) 108 (90 Base) MCG/ACT inhaler Inhale 2 puffs into the lungs every 6 (six) hours as needed for wheezing or shortness of breath. 12/24/21   Gustavus Bryant, FNP  benzonatate (TESSALON) 100 MG capsule Take 1 capsule (100 mg total) by mouth every 8 (eight) hours as needed for cough. 12/24/21   Gustavus Bryant, FNP  cetirizine (ZYRTEC) 10 MG tablet Take 1 tablet (10 mg total) by mouth daily. 12/27/21   McDiarmid, Leighton Roach, MD  ibuprofen (ADVIL) 600 MG tablet Take 1 tablet (600 mg total) by mouth every 6 (six) hours as needed (pain). 11/27/21   Worthy Rancher, MD  Prenatal Vit-Fe Fumarate-FA (MULTIVITAMIN-PRENATAL) 27-0.8 MG TABS tablet Take 1 tablet by mouth daily at 12 noon.    [provider]  triamcinolone cream (KENALOG) 0.1 % Apply 1 application topically 2 (two) times daily. 05/03/21   McDiarmid, Leighton Roach, MD    Family History Family History  Problem Relation Age of Onset   Alcohol abuse Mother 42   Drug abuse Mother 18   Mental illness Mother 71   Clotting disorder Mother 57       Hypercoagulopathy undefined but causing large aortic thromboemboli   Kidney disease Father    Diabetes Maternal Grandmother    Colon cancer Neg Hx    Esophageal cancer Neg Hx    Pancreatic cancer Neg Hx    Stomach cancer Neg Hx     Social History Social History   Tobacco Use   Smoking status: Former    Types: Cigarettes    Quit date: 08/20/2016    Years since quitting: 5.3   Smokeless tobacco: Never  Vaping Use   Vaping Use: Never used  Substance Use Topics   Alcohol use: Not Currently    Comment: not while preg   Drug  use: Not Currently    Types: Marijuana    Comment: last use nov 2022     Allergies   Valproic acid   Review of Systems Review of Systems  HENT:  Positive for congestion, ear pain, sinus pressure and sinus pain. Negative for ear discharge, sore throat and voice change.   Respiratory: Negative.    Gastrointestinal: Negative.   Musculoskeletal: Negative.  Neurological: Negative.  Negative for headaches.     Physical Exam Triage Vital Signs ED Triage Vitals  Enc Vitals Group     BP 12/31/21 0917 139/89     Pulse Rate 12/31/21 0914 (!) 55     Resp 12/31/21 0914 18     Temp 12/31/21 0914 99.4 F (37.4 C)     Temp Source 12/31/21 0914 Oral     SpO2 12/31/21 0914 95 %     Weight --      Height --      Head Circumference --      Peak Flow --      Pain Score 12/31/21 0916 6     Pain Loc --      Pain Edu? --      Excl. in GC? --    No data found.  Updated Vital Signs BP 139/89   Pulse (!) 55   Temp 99.4 F (37.4 C) (Oral)   Resp 18   LMP 12/25/2021   SpO2 95%   Visual Acuity Right Eye Distance:   Left Eye Distance:   Bilateral Distance:    Right Eye Near:   Left Eye Near:    Bilateral Near:     Physical Exam Vitals and nursing note reviewed.  Constitutional:      General: She is not in acute distress.    Appearance: She is not ill-appearing.  HENT:     Right Ear: Tympanic membrane normal.     Ears:     Comments: Left tympanic membrane is erythematous.  No middle ear effusion.  No discharge.    Mouth/Throat:     Pharynx: No posterior oropharyngeal erythema.  Eyes:     Conjunctiva/sclera: Conjunctivae normal.  Cardiovascular:     Rate and Rhythm: Normal rate and regular rhythm.     Pulses: Normal pulses.     Heart sounds: Normal heart sounds.  Pulmonary:     Effort: Pulmonary effort is normal.     Breath sounds: Normal breath sounds.  Musculoskeletal:     Cervical back: Normal range of motion.  Neurological:     Mental Status: She is alert.       UC Treatments / Results  Labs (all labs ordered are listed, but only abnormal results are displayed) Labs Reviewed - No data to display  EKG   Radiology No results found.  Procedures Procedures (including critical care time)  Medications Ordered in UC Medications - No data to display  Initial Impression / Assessment and Plan / UC Course  I have reviewed the triage vital signs and the nursing notes.  Pertinent labs & imaging results that were available during my care of the patient were reviewed by me and considered in my medical decision making (see chart for details).     1.  Left otitis media with effusion: Amoxicillin 500 mg 3 times daily x7 days Ibuprofen as needed for pain and/or fever Return to urgent care if symptoms worsen. Humidifier use will help with nasal congestion and postnasal drainage  Final Clinical Impressions(s) / UC Diagnoses   Final diagnoses:  Left otitis media with effusion     Discharge Instructions      This take medications as prescribed Mucinex as needed Increase oral fluid intake Humidifier use will help with nasal congestion Return to urgent care if symptoms worsen.   ED Prescriptions     Medication Sig Dispense Auth. Provider   amoxicillin (AMOXIL) 500 MG capsule Take 1 capsule (500  mg total) by mouth 3 (three) times daily. 21 capsule Shalin Vonbargen, Myrene Galas, MD      PDMP not reviewed this encounter.   Chase Picket, MD 12/31/21 1147

## 2021-12-31 NOTE — Discharge Instructions (Addendum)
This take medications as prescribed Mucinex as needed Increase oral fluid intake Humidifier use will help with nasal congestion Return to urgent care if symptoms worsen.

## 2022-04-11 ENCOUNTER — Ambulatory Visit (INDEPENDENT_AMBULATORY_CARE_PROVIDER_SITE_OTHER): Payer: Medicaid Other

## 2022-04-11 DIAGNOSIS — Z23 Encounter for immunization: Secondary | ICD-10-CM

## 2022-05-30 ENCOUNTER — Other Ambulatory Visit: Payer: Self-pay | Admitting: Family Medicine

## 2022-05-30 DIAGNOSIS — H608X3 Other otitis externa, bilateral: Secondary | ICD-10-CM

## 2022-05-30 DIAGNOSIS — L209 Atopic dermatitis, unspecified: Secondary | ICD-10-CM

## 2022-05-30 MED ORDER — TRIAMCINOLONE ACETONIDE 0.1 % EX CREA: 1.0000 | TOPICAL_CREAM | Freq: Two times a day (BID) | CUTANEOUS | 5 refills | Status: AC

## 2022-07-08 IMAGING — US US OB < 14 WEEKS - US OB TV
1 series · 15 of 28 positions shown · non-contrast
Comparison: None

CLINICAL DATA: Heavy vaginal bleeding, first trimester pregnancy;
uncertain LMP; quantitative beta hCG pending

EXAM:
OBSTETRIC <14 WK US AND TRANSVAGINAL OB US
TECHNIQUE: Both transabdominal and transvaginal ultrasound examinations were
performed for complete evaluation of the gestation as well as the
maternal uterus, adnexal regions, and pelvic cul-de-sac.
Transvaginal technique was performed to assess early pregnancy.

[Series 1: us ob < 14 weeks - us ob tv · 15 of 82 slices shown]
[im 1/82]
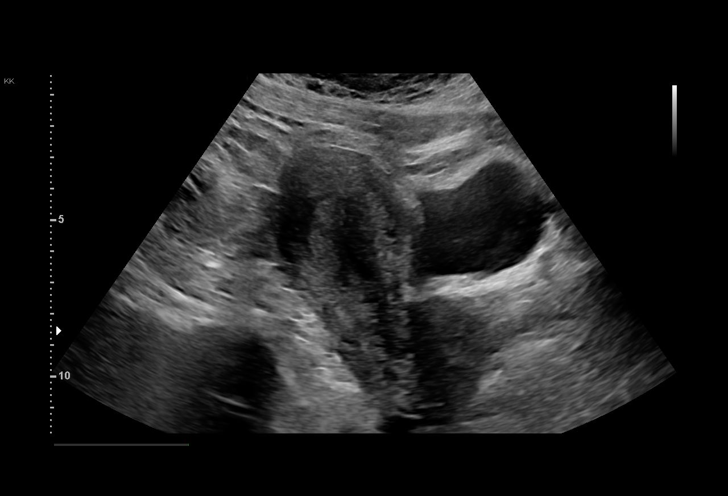
[im 7/82]
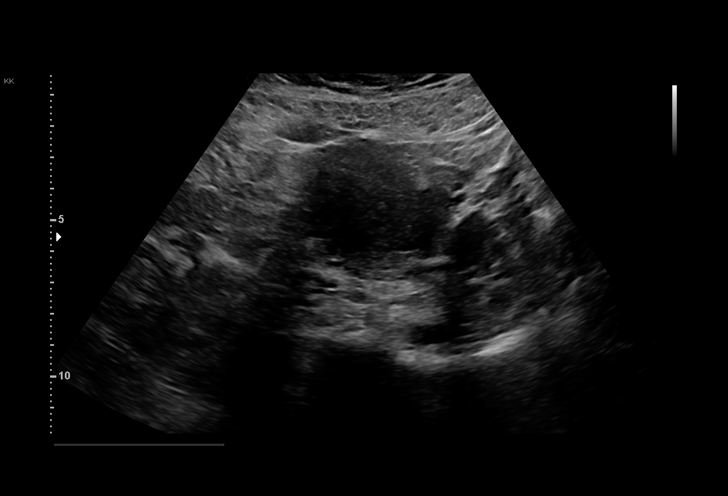
[im 13/82]
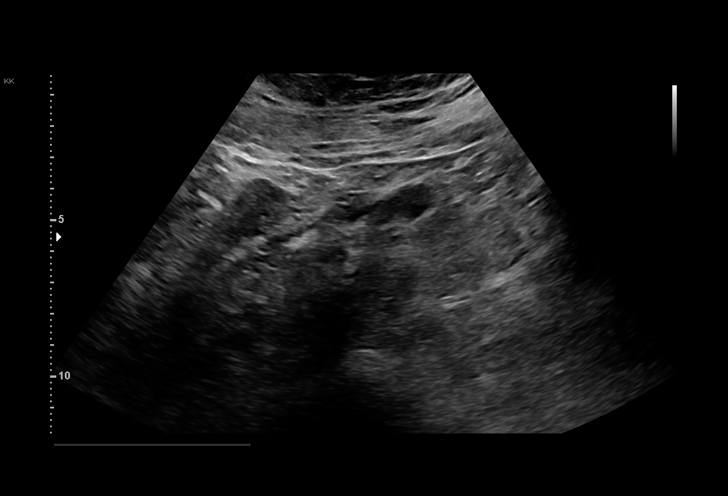
[im 19/82]
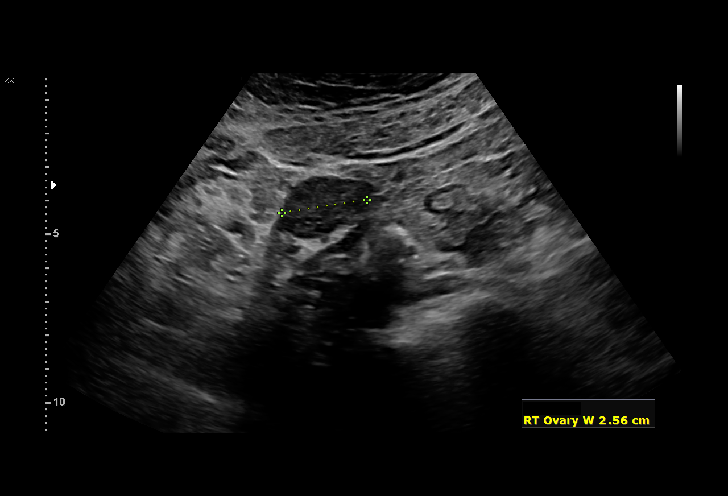
[im 25/82]
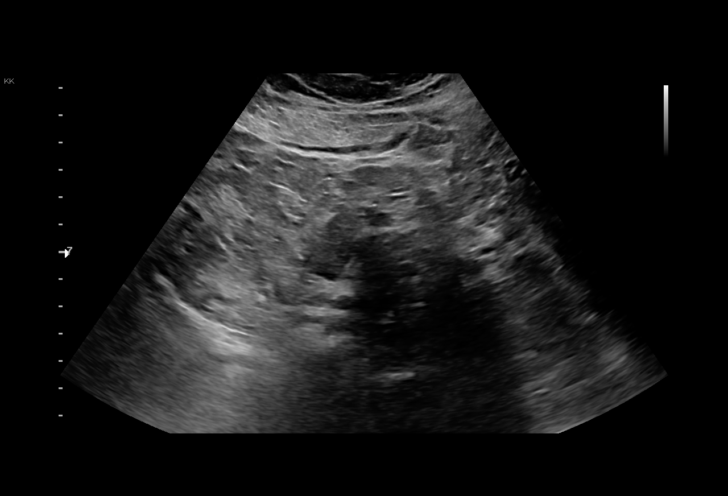
[im 31/82]
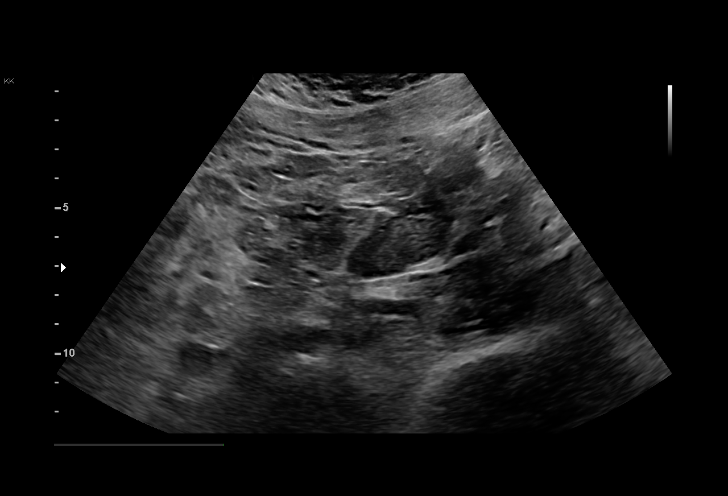
[im 37/82]
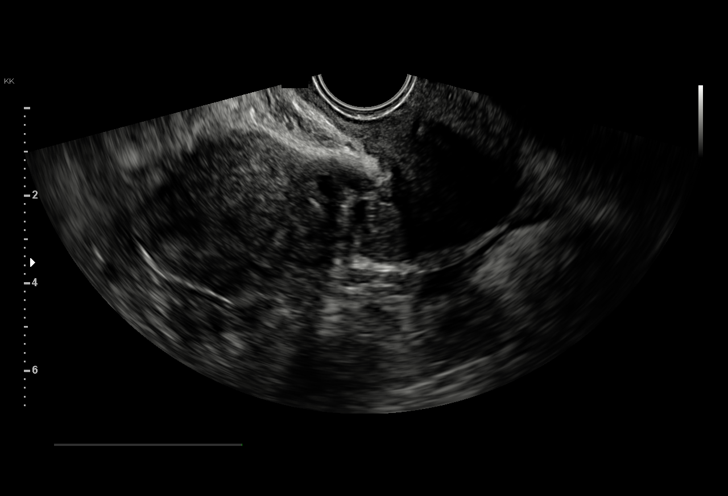
[im 43/82]
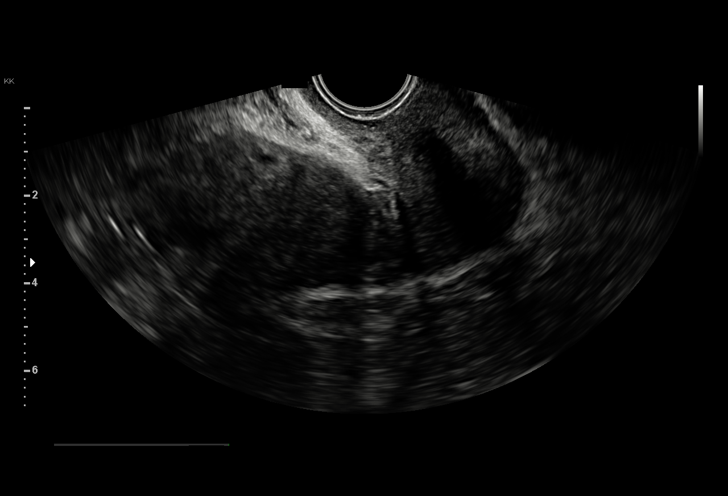
[im 46/82]
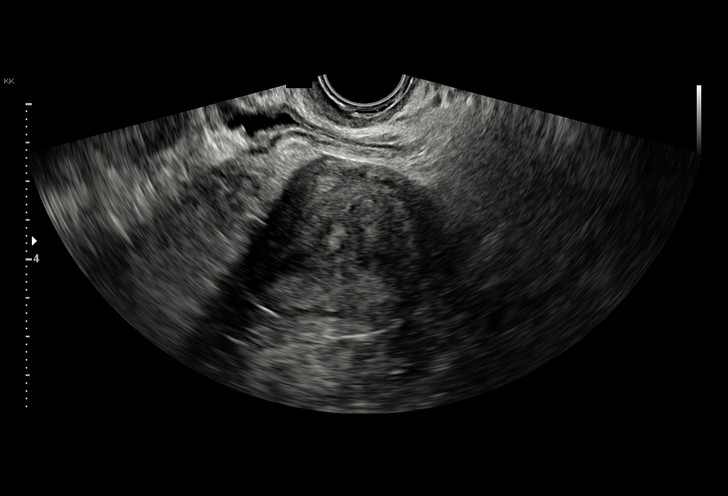
[im 52/82]
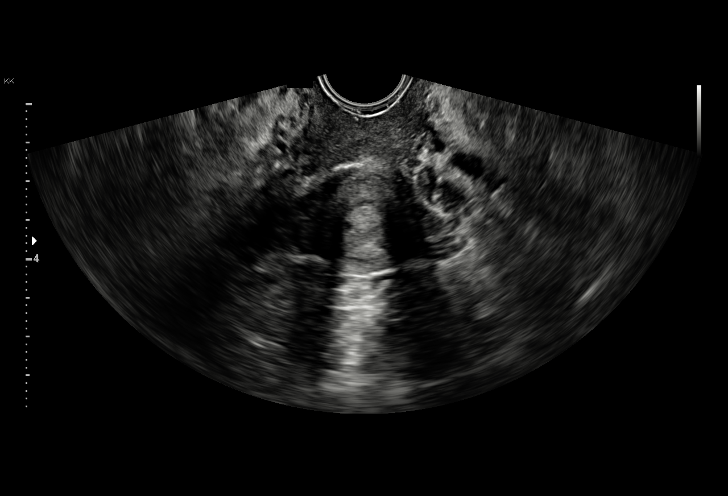
[im 58/82]
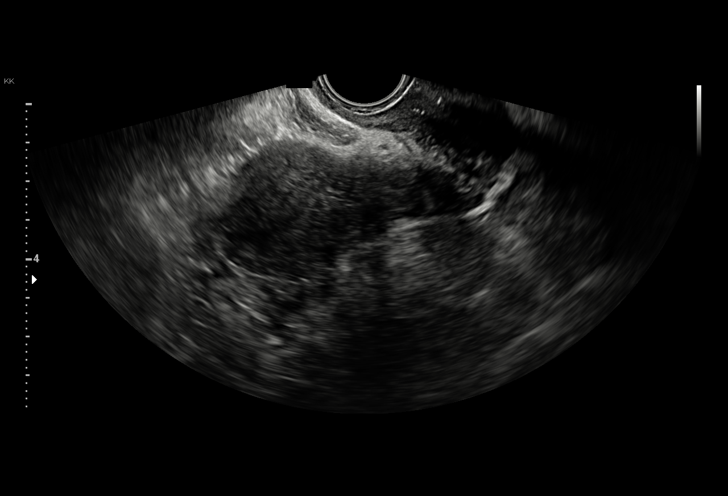
[im 64/82]
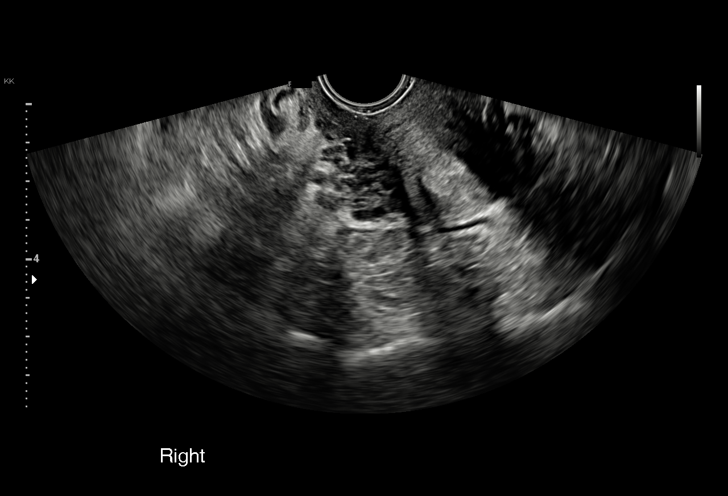
[im 70/82]
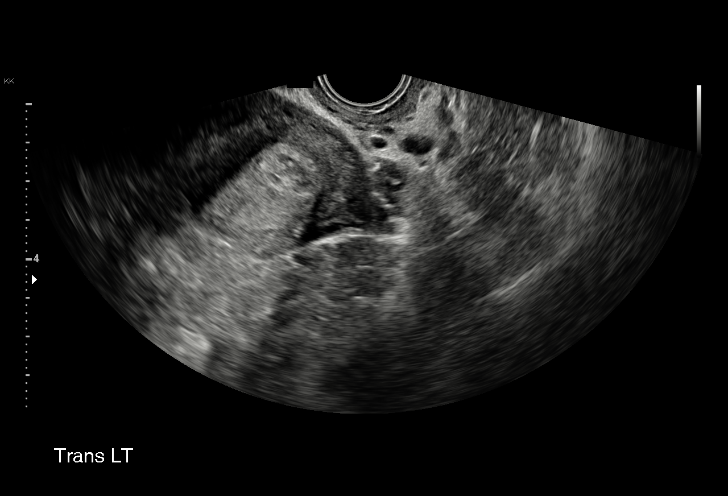
[im 76/82]
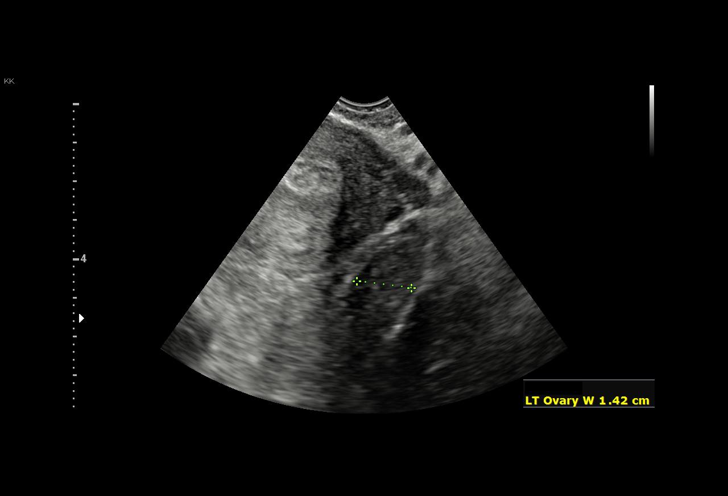
[im 82/82]
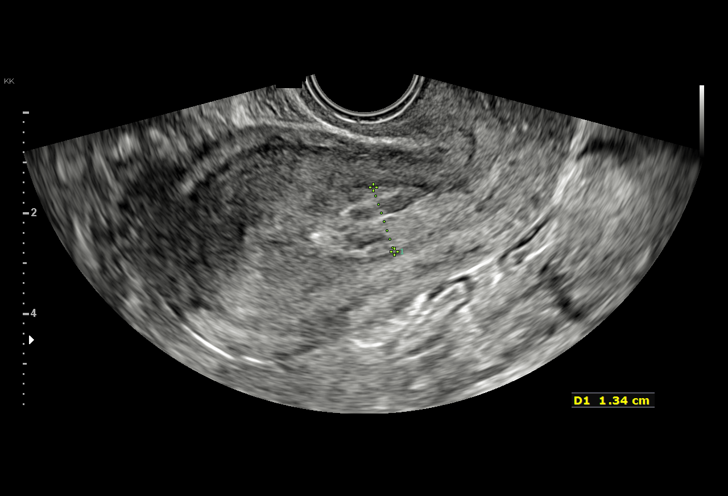

[15 of 28 positions shown; findings below may reference images not displayed]

FINDINGS: Intrauterine gestational sac: Absent

Yolk sac:  N/A

Embryo:  N/A

Cardiac Activity: N/A

Heart Rate: N/A  bpm

MSD:   mm    w     d

CRL:    mm    w    d                  US EDC:

Subchorionic hemorrhage:  N/A

Maternal uterus/adnexae:

Uterus anteverted without focal mass.

Endometrial complex heterogeneous and focally thickened at the mid
to lower uterine segments up to 13 mm thick, suspicious for blood,
cannot exclude products of conception.

No discrete gestational sac identified.

LEFT ovary normal size and morphology, 2.9 x 1.9 x 1.4 cm.

RIGHT ovary normal size and morphology, 2.6 x 2.8 x 1.7 cm.

No free pelvic fluid or adnexal masses.
IMPRESSION: Abnormal heterogeneous appearing material within the endometrial
canal, nonspecific but suspicious for blood; cannot exclude products
of conception.

No intrauterine gestation is definitely identified.

Findings are consistent with pregnancy of unknown location.

Differential diagnosis includes early intrauterine pregnancy too
early to visualize, spontaneous abortion, and ectopic pregnancy.

Serial quantitative beta HCG and or follow-up ultrasound recommended
to definitively exclude ectopic pregnancy.

## 2022-07-10 ENCOUNTER — Ambulatory Visit
Admission: EM | Admit: 2022-07-10 | Discharge: 2022-07-10 | Disposition: A | Discharge status: Home / Self Care | Payer: Medicaid Other | Attending: Urgent Care | Admitting: Urgent Care

## 2022-07-10 ENCOUNTER — Ambulatory Visit: Payer: Medicaid Other

## 2022-07-10 DIAGNOSIS — K589 Irritable bowel syndrome without diarrhea: Secondary | ICD-10-CM | POA: Diagnosis not present

## 2022-07-10 DIAGNOSIS — A084 Viral intestinal infection, unspecified: Secondary | ICD-10-CM | POA: Diagnosis not present

## 2022-07-10 DIAGNOSIS — R112 Nausea with vomiting, unspecified: Secondary | ICD-10-CM

## 2022-07-10 DIAGNOSIS — R197 Diarrhea, unspecified: Secondary | ICD-10-CM | POA: Diagnosis not present

## 2022-07-10 MED ORDER — LOPERAMIDE HCL 2 MG PO CAPS: 2.0000 mg | ORAL_CAPSULE | Freq: Two times a day (BID) | ORAL | 0 refills | Status: DC | PRN

## 2022-07-10 MED ORDER — ONDANSETRON 8 MG PO TBDP: 8.0000 mg | ORAL_TABLET | Freq: Three times a day (TID) | ORAL | 0 refills | Status: DC | PRN

## 2022-07-10 NOTE — Discharge Instructions (Signed)

## 2022-07-10 NOTE — ED Provider Notes (Signed)
[facility]  Note:  This document was prepared using Conservation officer, historic buildings and may include unintentional dictation errors.  MRN: [id number] DOB: [birth year]  Subjective:   Alice Gibson is a 21 y.o. female presenting for 2-3 day history of acute onset nausea, vomiting, diarrhea, belly pain. No fever, cough, chest pain, shob, wheezing. No fever, bloody stools, recent antibiotic use, hospitalizations or long distance travel.  Has not eaten raw foods, drank unfiltered water.  No history of Crohn's, ulcerative colitis. Has a history of IBS. Limits her stressors.   No current facility-administered medications for this encounter.  Current Outpatient Medications:    acetaminophen (TYLENOL) 500 MG tablet, Take 2 tablets (1,000 mg total) by mouth every 8 (eight) hours as needed (pain)., Disp: 60 tablet, Rfl: 0   albuterol (VENTOLIN HFA) 108 (90 Base) MCG/ACT inhaler, Inhale 2 puffs into the lungs every 6 (six) hours as needed for wheezing or shortness of breath., Disp: 18 g, Rfl: 5   amoxicillin (AMOXIL) 500 MG capsule, Take 1 capsule (500 mg total) by mouth 3 (three) times daily., Disp: 21 capsule, Rfl: 0   benzonatate (TESSALON) 100 MG capsule, Take 1 capsule (100 mg total) by mouth every 8 (eight) hours as needed for cough., Disp: 21 capsule, Rfl: 0   cetirizine (ZYRTEC) 10 MG tablet, Take 1 tablet (10 mg total) by mouth daily., Disp: 30 tablet, Rfl: 11   ibuprofen (ADVIL) 600 MG tablet, Take 1 tablet (600 mg total) by mouth every 6 (six) hours as needed (pain)., Disp: 40 tablet, Rfl: 0   Prenatal Vit-Fe Fumarate-FA (MULTIVITAMIN-PRENATAL) 27-0.8 MG TABS tablet, Take 1 tablet by mouth daily at 12 noon., Disp: , Rfl:    triamcinolone cream (KENALOG) 0.1 %, Apply 1 Application topically 2 (two) times daily., Disp: 80 g, Rfl: 5   Allergies  Allergen Reactions   Valproic Acid Other (See Comments)    essential tremor    Past Medical History:  Diagnosis Date    Abnormal urination 04/26/2014   Allergic rhinitis due to cats 05/02/2016   Allergic to animal dander 05/02/2016   Equivocal reactivity to Cat and to Dog danders on skin-prick testing by Dr Willa Rough (Allergist, 01/2008)   Allergy to mold 05/02/2016   Equivocal reactivity to mold on skin-prick testing by Dr Willa Rough (Allergist, 01/2008)   Allergy to pollen 05/02/2016   Strong allergic response to skin-prick testing ( Dr Willa Rough (Allergist) 01/2008) to tree pollens   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   Asthma    Asthma, mild persistent 03/12/2007   Qualifier: Diagnosis of  By: Humberto Seals NP, Darl Pikes      ASTHMA, PERSISTENT 03/12/2007   Qualifier: Diagnosis of  By: Humberto Seals NP, Susan      Back pain 05/18/2013   CHILDHOOD OBESITY 10/27/2008   Qualifier: Diagnosis of  By: McDiarmid MD, Todd     Constipation 10/05/2013   DERMATITIS, ATOPIC 12/18/2007   Qualifier: Diagnosis of  By: McDiarmid MD, Todd     Disordered sleep 12/02/2016   Disordered sleep 12/02/2016   Eczema 11/28/2015   Exercise-induced asthma 05/02/2016   Expressive language disorder 07/30/2010   Qualifier: Diagnosis of  By: McDiarmid MD, Dolores Patty MAL SEIZURE 08/22/2009   Qualifier: History of  By: McDiarmid MD, Todd     History of atopic dermatitis 12/18/2007   History of atopic dermatitis as child.      History of atopic dermatitis 12/18/2007   History of atopic dermatitis as  child.      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)     Major depressive disorder in remission (HCC) 07/21/2015   Major depressive disorder in remission (HCC) 07/21/2015   MDD (major depressive disorder), recurrent episode, severe (HCC) 04/27/2015   Obesity 10/27/2008   Qualifier: Diagnosis of  By: McDiarmid MD, Clifton James of right ear 07/21/2015   Peanut allergy 10/05/2013   Scoliosis, adolescent acquired 09/11/2015   SEIZURE DISORDER, HX OF  04/02/2007   Qualifier: Diagnosis of  By: McDiarmid MD, Todd     Seizures [facility])    Severe episode of recurrent major depressive disorder, without psychotic features (HCC)    Smoking 07/21/2015   Superficial acne vulgaris 05/02/2016   Syncope 07/21/2015     Past Surgical History:  Procedure Laterality Date   NO PAST SURGERIES      Family History  Problem Relation Age of Onset   Alcohol abuse Mother 14   Drug abuse Mother 25   Mental illness Mother 38   Clotting disorder Mother 75       Hypercoagulopathy undefined but causing large aortic thromboemboli   Kidney disease Father    Diabetes Maternal Grandmother    Colon cancer Neg Hx    Esophageal cancer Neg Hx    Pancreatic cancer Neg Hx    Stomach cancer Neg Hx     Social History   Tobacco Use   Smoking status: Former    Types: Cigarettes    Quit date: 08/20/2016    Years since quitting: 5.8   Smokeless tobacco: Never  Vaping Use   Vaping Use: Never used  Substance Use Topics   Alcohol use: Yes    Comment: occ   Drug use: Not Currently    ROS   Objective:   Vitals: BP 134/84 (BP Location: Right Arm)   Pulse 73   Temp 99.1 F (37.3 C) (Oral)   Resp 15   LMP 07/07/2022   SpO2 98%   Physical Exam Constitutional:      General: She is not in acute distress.    Appearance: Normal appearance. She is well-developed. She is not ill-appearing, toxic-appearing or diaphoretic.  HENT:     Head: Normocephalic and atraumatic.     Nose: Nose normal.     Mouth/Throat:     Mouth: Mucous membranes are moist.  Eyes:     General: No scleral icterus.       Right eye: No discharge.        Left eye: No discharge.     Extraocular Movements: Extraocular movements intact.     Conjunctiva/sclera: Conjunctivae normal.  Cardiovascular:     Rate and Rhythm: Normal rate.  Pulmonary:     Effort: Pulmonary effort is normal.  Abdominal:     General: Bowel sounds are normal. There is no distension.     Palpations: Abdomen is soft.  There is no mass.     Tenderness: There is no abdominal tenderness. There is no right CVA tenderness, left CVA tenderness, guarding or rebound.  Skin:    General: Skin is warm and dry.  Neurological:     General: No focal deficit present.     Mental Status: She is alert and oriented to person, place, and time.  Psychiatric:        Mood and Affect: Mood normal.  Behavior: Behavior normal.        Thought Content: Thought content normal.        Judgment: Judgment normal.    Assessment and Plan :   PDMP not reviewed this encounter.  1. Viral gastroenteritis   2. Nausea vomiting and diarrhea   3. Irritable bowel syndrome, unspecified type     No signs of an acute abdomen. Will manage for suspected viral gastroenteritis with supportive care.  Recommended patient hydrate well, eat light meals and maintain electrolytes.  Will use Zofran and Imodium for nausea, vomiting and diarrhea. Counseled patient on potential for adverse effects with medications prescribed/recommended today, ER and return-to-clinic precautions discussed, patient verbalized understanding.    Wallis Bamberg, PA-C 07/10/22 1740

## 2022-07-10 NOTE — ED Triage Notes (Signed)
Pt c/o n/v/d, generalized abd pain started 12/25-states she feels better-was notified by work she needs RTW-NAD-steady gait

## 2022-09-02 ENCOUNTER — Ambulatory Visit (INDEPENDENT_AMBULATORY_CARE_PROVIDER_SITE_OTHER): Payer: Medicaid Other | Admitting: Family Medicine

## 2022-09-02 ENCOUNTER — Encounter: Payer: Self-pay | Admitting: Family Medicine

## 2022-09-02 VITALS — BP 132/88 | HR 82 | Ht 59.0 in | Wt 282.2 lb

## 2022-09-02 DIAGNOSIS — R110 Nausea: Secondary | ICD-10-CM

## 2022-09-02 MED ORDER — OMEPRAZOLE 20 MG PO CPDR: 20.0000 mg | DELAYED_RELEASE_CAPSULE | Freq: Every day | ORAL | 3 refills | Status: AC

## 2022-09-02 NOTE — Patient Instructions (Signed)
It was great seeing you today!  Today we discussed your symptoms. I have prescribed omperazole 20 mg, please take this daily. Please keep a food diary and note when your symptoms get worse.   Please avoid spicy foods, tomato containing foods, alcohol and other triggers. Please make sure to eat your last meal no later than 3 hours before bedtime.   Please follow up at your next scheduled appointment in 4 weeks, if anything arises between now and then, please don't hesitate to contact our office.   Thank you for allowing [place] to be a part of your medical care!  Thank you, Dr. Larae Grooms  Also a reminder of our clinic's no-show policy. Please make sure to arrive at least 15 minutes prior to your scheduled appointment time. Please try to cancel before 24 hours if you are not able to make it. If you no-show for 2 appointments then you will be receiving a warning letter. If you no-show after 3 visits, then you may be at risk of being dismissed from our clinic. This is to ensure that everyone is able to be seen in a timely manner. Thank you, we appreciate your assistance with this!

## 2022-09-02 NOTE — Assessment & Plan Note (Signed)
-  unsure of exact etiology, possible multifactorial involving history of IBS as well. Seems that reflux is contributing to her symptoms as well so will trial with omeprazole daily for treatment. Also considered pregnancy, upreg ordered but patient declines  -instructed to maintain food diary of diet and symptoms  -GERD precautions discussed and handout provided  -lifestyle modifications discussed as well which can improve symptoms  -follow up in 1 month, consider H pylori testing or repeat EGD

## 2022-09-02 NOTE — Progress Notes (Signed)
    SUBJECTIVE:   CHIEF COMPLAINT / HPI:   Patient presents nausea and vomiting for 2-3 weeks, this has not occurred before. Has a history of diarrhea and irregular stool patterns so she had an EGD and colonoscopy performed in 2021. Both were normal and was deemed to have IBS. Since then, she has been managing it by eating certain foods which has been doing ok. She feels that the food stays in her stomach from the night before and when she vomits it ends up being the food from last night. Does not follow with the GI specialist. Does not have history of DM. Bowel movements have improved from being loose, sometimes soft and formed. She occasionally has firm stools. Denies hematochezia, melena or hematemesis. Denies epigastric pain. Has been burping  a lot and endorses globus sensation some days. Eats her latest meal at 9 or 10 pm.   OBJECTIVE:   BP 132/88   Pulse 82   Ht 4' 11"$  (1.499 m)   Wt 282 lb 4 oz (128 kg)   SpO2 100%   BMI 57.01 kg/m   General: Patient well-appearing, in no acute distress. CV: RRR, no murmurs or gallops auscultated Resp: CTAB, no wheezing, rales or rhonchi noted Abdomen: soft, nontender, nondistended, presence of bowel sounds  ASSESSMENT/PLAN:   Nausea -unsure of exact etiology, possible multifactorial involving history of IBS as well. Seems that reflux is contributing to her symptoms as well so will trial with omeprazole daily for treatment. Also considered pregnancy, upreg ordered but patient declines  -instructed to maintain food diary of diet and symptoms  -GERD precautions discussed and handout provided  -lifestyle modifications discussed as well which can improve symptoms  -follow up in 1 month, consider H pylori testing or repeat EGD   -PHQ-9 score of 5 with negative question 9 reviewed.   Donney Dice, [place]

## 2022-10-03 ENCOUNTER — Encounter: Payer: Self-pay | Admitting: Family Medicine

## 2022-10-03 ENCOUNTER — Ambulatory Visit: Payer: Medicaid Other | Admitting: Family Medicine

## 2022-10-30 ENCOUNTER — Ambulatory Visit
Admission: EM | Admit: 2022-10-30 | Discharge: 2022-10-30 | Disposition: A | Discharge status: Home / Self Care | Payer: Medicaid Other | Attending: Family Medicine | Admitting: Family Medicine

## 2022-10-30 DIAGNOSIS — J4521 Mild intermittent asthma with (acute) exacerbation: Secondary | ICD-10-CM | POA: Insufficient documentation

## 2022-10-30 DIAGNOSIS — Z87891 Personal history of nicotine dependence: Secondary | ICD-10-CM | POA: Diagnosis not present

## 2022-10-30 DIAGNOSIS — U071 COVID-19: Secondary | ICD-10-CM | POA: Diagnosis not present

## 2022-10-30 DIAGNOSIS — R059 Cough, unspecified: Secondary | ICD-10-CM | POA: Diagnosis present

## 2022-10-30 MED ORDER — PREDNISONE 20 MG PO TABS: 40.0000 mg | ORAL_TABLET | Freq: Every day | ORAL | 0 refills | Status: DC

## 2022-10-30 MED ORDER — ALBUTEROL SULFATE HFA 108 (90 BASE) MCG/ACT IN AERS: 2.0000 | INHALATION_SPRAY | Freq: Four times a day (QID) | RESPIRATORY_TRACT | 5 refills | Status: AC | PRN

## 2022-10-30 NOTE — ED Triage Notes (Signed)
Pt c/o dyspnea, sweating, malaise, fever at home onset ~ yesterday. Presents in triage clearly sweating in the face. Uses albuterol but ran out last month. Additional cough, mild sore throat and nasal drainage   Requesting  1) covid test  2) albuterol refill  3) md note for work

## 2022-10-30 NOTE — Discharge Instructions (Signed)
You have been tested for COVID-19 today. °If your test returns positive, you will receive a phone call from [facility] regarding your results. °Negative test results are not called. °Both positive and negative results area always visible on MyChart. °If you do not have a MyChart account, sign up instructions are provided in your discharge papers. °Please do not hesitate to contact us should you have questions or concerns. ° °

## 2022-10-30 NOTE — ED Provider Notes (Signed)
[facility]   [id number] 10/30/22 Arrival Time: 0810  ASSESSMENT & PLAN:  1. Mild intermittent asthma with acute exacerbation    No current resp distress.  Meds ordered this encounter  Medications   albuterol (VENTOLIN HFA) 108 (90 Base) MCG/ACT inhaler    Sig: Inhale 2 puffs into the lungs every 6 (six) hours as needed for wheezing or shortness of breath.    Dispense:  18 g    Refill:  5   predniSONE (DELTASONE) 20 MG tablet    Sig: Take 2 tablets (40 mg total) by mouth daily.    Dispense:  10 tablet    Refill:  0   Work note provided. Asthma precautions given. OTC symptom care as needed.  Recommend:  Follow-up Information     McDiarmid, Leighton Roach, MD.   Specialty: Family Medicine Why: If worsening or failing to improve as anticipated. Contact information: [address] [place] [address] [phone]                 Reviewed expectations re: course of current medical issues. Questions answered. Outlined signs and symptoms indicating need for more acute intervention. Patient verbalized understanding. After Visit Summary given.  SUBJECTIVE: History from: patient.  Alice Gibson is a 22 y.o. female who presents with complaint of asthma exac with dyspnea, sweating, malaise, fever at home onset ~ yesterday. Uses albuterol but ran out last month. Additional cough, mild sore throat and nasal drainage.  Requesting  1) covid test  2) albuterol refill  3) md note for work    Social History   Tobacco Use  Smoking Status Former   Types: Cigarettes   Quit date: 08/20/2016   Years since quitting: 6.1  Smokeless Tobacco Never    OBJECTIVE:  Vitals:   10/30/22 0819  BP: 135/88  Pulse: 95  Resp: 16  Temp: 98.9 F (37.2 C)  TempSrc: Oral  SpO2: 97%     General appearance: alert; NAD HEENT: [place]; AT; with mild nasal congestion Neck: supple without LAD Cv: RRR without murmer Lungs: unlabored respirations, moderate bilateral  expiratory wheezing; cough: mild; no significant respiratory distress Skin: warm and dry Psychological: alert and cooperative; normal mood and affect    Allergies  Allergen Reactions   Valproic Acid Other (See Comments)    essential tremor    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   Asthma    Asthma, mild persistent 03/12/2007   Qualifier: Diagnosis of  By: Humberto Seals NP, Darl Pikes      ASTHMA, PERSISTENT 03/12/2007   Qualifier: Diagnosis of  By: Humberto Seals NP, Susan      Back pain 05/18/2013   CHILDHOOD OBESITY 10/27/2008   Qualifier: Diagnosis of  By: McDiarmid MD, Todd     Constipation 10/05/2013   DERMATITIS, ATOPIC 12/18/2007   Qualifier: Diagnosis of  By: McDiarmid MD, Todd     Disordered sleep 12/02/2016   Disordered sleep 12/02/2016   Eczema 11/28/2015   Encounter for initial prescription  of implantable subdermal contraceptive    Exercise-induced asthma 05/02/2016   Expressive language disorder 07/30/2010   Qualifier: Diagnosis of  By: McDiarmid MD, Dolores Patty MAL SEIZURE 08/22/2009   Qualifier: History of  By: McDiarmid MD, Todd     Group B streptococcal bacteriuria 08/20/2021   In Dec 2022, will need antibiotics in labor   History of atopic dermatitis 12/18/2007   History of atopic dermatitis as child.      History of atopic dermatitis 12/18/2007   History of atopic dermatitis as child.      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  Induced at 38 weeks 5 days, baby weighted 6 lbs 1 oz.    Scoliosis, adolescent acquired 09/11/2015   SEIZURE DISORDER, HX OF 04/02/2007   Qualifier: Diagnosis of  By: McDiarmid MD, Todd     Seizures    Severe episode of recurrent major depressive disorder, without psychotic features    Smoking 07/21/2015   Superficial acne vulgaris 05/02/2016   Syncope 07/21/2015   Family History  Problem Relation Age of Onset   Alcohol abuse Mother 35   Drug abuse Mother 93   Mental illness Mother 87   Clotting disorder Mother 69       Hypercoagulopathy undefined but causing large aortic thromboemboli   Kidney disease Father    Diabetes Maternal Grandmother    Colon cancer Neg Hx    Esophageal cancer Neg Hx    Pancreatic cancer Neg Hx    Stomach cancer Neg Hx    Social History   Socioeconomic History   Marital status: Single    Spouse name: Alysia Penna   Number of children: 2   Years of education: Not on file   Highest education level: Not on file  Occupational History   Not on file  Tobacco Use   Smoking status: Former    Types: Cigarettes    Quit date: 08/20/2016    Years since quitting: 6.1   Smokeless tobacco: Never  Vaping Use   Vaping Use: Never used  Substance and Sexual Activity   Alcohol use: Yes    Comment: occ   Drug use: Not Currently   Sexual activity: Not on file  Other Topics Concern   Not on file  Social History Narrative      Social Determinants of Health   Financial  Resource Strain: Not on file  Food Insecurity: Not on file  Transportation Needs: Not on file  Physical Activity: Not on file  Stress: Not on file  Social Connections: Not on file  Intimate Partner Violence: Not on file             [place], MD 10/30/22 [phone]

## 2022-10-31 ENCOUNTER — Telehealth: Payer: Self-pay

## 2022-10-31 LAB — SARS CORONAVIRUS 2 (TAT 6-24 HRS): SARS Coronavirus 2: POSITIVE — AB

## 2022-10-31 NOTE — Telephone Encounter (Signed)
Patient calls nurse line requesting an accommodations letter for work.   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   She reports her allergies are bad this time of year and can cause an asthma flare.   She is requesting to be excluded from outside duties.   Will forward to PCP.

## 2022-11-01 NOTE — Telephone Encounter (Signed)
Work restriction note requesting patient be excused from outdoor duties because of her seasonal asthma.

## 2022-12-04 ENCOUNTER — Ambulatory Visit
Admission: EM | Admit: 2022-12-04 | Discharge: 2022-12-04 | Disposition: A | Discharge status: Home / Self Care | Payer: Medicaid Other | Attending: Nurse Practitioner | Admitting: Nurse Practitioner

## 2022-12-04 DIAGNOSIS — R110 Nausea: Secondary | ICD-10-CM | POA: Diagnosis not present

## 2022-12-04 DIAGNOSIS — R062 Wheezing: Secondary | ICD-10-CM | POA: Diagnosis not present

## 2022-12-04 DIAGNOSIS — J4521 Mild intermittent asthma with (acute) exacerbation: Secondary | ICD-10-CM

## 2022-12-04 DIAGNOSIS — H66005 Acute suppurative otitis media without spontaneous rupture of ear drum, recurrent, left ear: Secondary | ICD-10-CM | POA: Diagnosis not present

## 2022-12-04 MED ORDER — AMOXICILLIN 875 MG PO TABS
875.0000 mg | ORAL_TABLET | Freq: Two times a day (BID) | ORAL | 0 refills | Status: AC
Start: 2022-12-04 — End: 2022-12-14

## 2022-12-04 MED ORDER — ONDANSETRON 4 MG PO TBDP: 4.0000 mg | ORAL_TABLET | Freq: Three times a day (TID) | ORAL | 0 refills | Status: DC | PRN

## 2022-12-04 MED ORDER — IPRATROPIUM-ALBUTEROL 0.5-2.5 (3) MG/3ML IN SOLN
3.0000 mL | Freq: Once | RESPIRATORY_TRACT | Status: AC
  Administered 2022-12-04: 3 mL via RESPIRATORY_TRACT

## 2022-12-04 MED ORDER — ONDANSETRON 4 MG PO TBDP
4.0000 mg | ORAL_TABLET | Freq: Once | ORAL | Status: AC
  Administered 2022-12-04: 4 mg via ORAL

## 2022-12-04 MED ORDER — PREDNISONE 20 MG PO TABS
40.0000 mg | ORAL_TABLET | Freq: Every day | ORAL | 0 refills | Status: AC
Start: 2022-12-04 — End: 2022-12-09

## 2022-12-04 NOTE — ED Provider Notes (Signed)
[facility]    CSN: [id number] Arrival date & time: 12/04/22  1022      History   Chief Complaint Chief Complaint  Patient presents with   Otalgia   Emesis    HPI Alice Gibson is a 22 y.o. female  presents for evaluation of URI symptoms for 4 days. Patient reports associated symptoms of cough, congestion, ear pain, nausea/vomiting. Denies diarrhea, fevers, body aches, sore throat, shortness of breath. Patient does have a hx of asthma.  Endorses some wheezing and has an albuterol inhaler that she has been using.  Her son has similar symptoms.  Pt has taken ibuprofen and allergy medicine OTC for symptoms.  She was seen in urgent care on 4/17 for cough.  Was positive COVID.  Was treated with prednisone with complete resolution of symptoms.  Pt has no other concerns at this time.    Otalgia Associated symptoms: congestion, cough and vomiting   Emesis Associated symptoms: cough     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   Asthma    Asthma, mild persistent 03/12/2007   Qualifier: Diagnosis of  By: Humberto Seals NP, Darl Pikes      ASTHMA, PERSISTENT 03/12/2007   Qualifier: Diagnosis of  By: Humberto Seals NP, Susan      Back pain 05/18/2013   CHILDHOOD OBESITY 10/27/2008   Qualifier: Diagnosis of  By: McDiarmid MD, Todd     Constipation 10/05/2013   DERMATITIS, ATOPIC 12/18/2007   Qualifier: Diagnosis of  By: McDiarmid MD, Todd     Disordered sleep 12/02/2016   Disordered sleep  12/02/2016   Eczema 11/28/2015   Encounter for initial prescription of implantable subdermal contraceptive    Exercise-induced asthma 05/02/2016   Expressive language disorder 07/30/2010   Qualifier: Diagnosis of  By: McDiarmid MD, Dolores Patty MAL SEIZURE 08/22/2009   Qualifier: History of  By: McDiarmid MD, Todd     Group B streptococcal bacteriuria 08/20/2021   In Dec 2022, will need antibiotics in labor   History of atopic dermatitis 12/18/2007   History of atopic dermatitis as child.      History of atopic dermatitis 12/18/2007   History of atopic dermatitis as child.      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  Induced at 38 weeks 5 days, baby weighted 6 lbs 1 oz.    Scoliosis, adolescent acquired 09/11/2015   SEIZURE DISORDER, HX OF  04/02/2007   Qualifier: Diagnosis of  By: McDiarmid MD, Todd     Seizures [facility])    Severe episode of recurrent major depressive disorder, without psychotic features (HCC)    Smoking 07/21/2015   Superficial acne vulgaris 05/02/2016   Syncope 07/21/2015    Patient Active Problem List   Diagnosis Date Noted   Atopic dermatitis 05/02/2016   Exercise-induced asthma 05/02/2016   Morbid obesity (HCC) 10/27/2008    Past Surgical History:  Procedure Laterality Date   NO PAST SURGERIES      OB History     Gravida  4   Para  3   Term  2   Preterm      AB  1   Living  3      SAB  1    IAB      Ectopic      Multiple  0   Live Births  3            Home Medications    Prior to Admission medications   Medication Sig Start Date End Date Taking? Authorizing Provider  amoxicillin (AMOXIL) 875 MG tablet Take 1 tablet (875 mg total) by mouth 2 (two) times daily for 10 days. 12/04/22 12/14/22 Yes Radford Pax, NP  ondansetron (ZOFRAN-ODT) 4 MG disintegrating tablet Take 1 tablet (4 mg total) by mouth every 8 (eight) hours as needed for nausea or vomiting. 12/04/22  Yes Radford Pax, NP  predniSONE (DELTASONE) 20 MG tablet Take 2 tablets (40 mg total) by mouth daily with breakfast for 5 days. 12/04/22 12/09/22 Yes Radford Pax, NP  acetaminophen (TYLENOL) 500 MG tablet Take 2 tablets (1,000 mg total) by mouth every 8 (eight) hours as needed (pain). 11/27/21   Worthy Rancher, MD  albuterol (VENTOLIN HFA) 108 (90 Base) MCG/ACT inhaler Inhale 2 puffs into the lungs every 6 (six) hours as needed for wheezing or shortness of breath. 10/30/22   Mardella Layman, MD  cetirizine (ZYRTEC) 10 MG tablet Take 1 tablet (10 mg total) by mouth daily. 12/27/21   McDiarmid, Leighton Roach, MD  omeprazole (PRILOSEC) 20 MG capsule Take 1 capsule (20 mg total) by mouth daily. 09/02/22   Ganta, Anupa, DO  triamcinolone cream (KENALOG) 0.1 % Apply 1 Application topically 2 (two) times daily. 05/30/22   McDiarmid, Leighton Roach, MD    Family History Family History  Problem Relation Age of Onset   Alcohol abuse Mother 66   Drug abuse Mother 65   Mental illness Mother 28   Clotting disorder Mother 43       Hypercoagulopathy undefined but causing large aortic thromboemboli   Kidney disease Father    Diabetes Maternal Grandmother    Colon cancer Neg Hx    Esophageal cancer Neg Hx    Pancreatic cancer Neg Hx    Stomach cancer Neg Hx     Social History Social History   Tobacco Use   Smoking status: Former    Types: Cigarettes    Quit date: 08/20/2016    Years since quitting: 6.2   Smokeless tobacco: Never   Vaping Use   Vaping Use: Never used  Substance Use Topics   Alcohol use: Yes    Comment: occ   Drug use: Not Currently     Allergies   Valproic acid   Review of Systems Review of Systems  HENT:  Positive for congestion and ear pain.   Respiratory:  Positive for cough and wheezing.  Gastrointestinal:  Positive for vomiting.     Physical Exam Triage Vital Signs ED Triage Vitals  Enc Vitals Group     BP 12/04/22 1046 113/76     Pulse Rate 12/04/22 1046 82     Resp 12/04/22 1046 16     Temp 12/04/22 1046 99 F (37.2 C)     Temp Source 12/04/22 1046 Oral     SpO2 12/04/22 1046 98 %     Weight --      Height --      Head Circumference --      Peak Flow --      Pain Score 12/04/22 1045 4     Pain Loc --      Pain Edu? --      Excl. in GC? --    No data found.  Updated Vital Signs BP 113/76 (BP Location: Left Arm)   Pulse 82   Temp 99 F (37.2 C) (Oral)   Resp 16   LMP 11/13/2022 (Exact Date)   SpO2 98%   Visual Acuity Right Eye Distance:   Left Eye Distance:   Bilateral Distance:    Right Eye Near:   Left Eye Near:    Bilateral Near:     Physical Exam Vitals and nursing note reviewed.  Constitutional:      General: She is not in acute distress.    Appearance: Normal appearance. She is well-developed. She is not ill-appearing or toxic-appearing.  HENT:     Head: Normocephalic and atraumatic.     Right Ear: Tympanic membrane and ear canal normal.     Left Ear: Ear canal normal. Tympanic membrane is erythematous.     Nose: Congestion present.     Mouth/Throat:     Mouth: Mucous membranes are moist.     Pharynx: Oropharynx is clear. Uvula midline. No oropharyngeal exudate or posterior oropharyngeal erythema.     Tonsils: No tonsillar exudate or tonsillar abscesses.  Eyes:     Conjunctiva/sclera: Conjunctivae normal.     Pupils: Pupils are equal, round, and reactive to light.  Cardiovascular:     Rate and Rhythm: Normal rate and regular rhythm.      Heart sounds: Normal heart sounds.  Pulmonary:     Effort: Pulmonary effort is normal.     Breath sounds: Wheezing present.     Comments: Wheezing resolved after nebulizer Musculoskeletal:     Cervical back: Normal range of motion and neck supple.  Lymphadenopathy:     Cervical: No cervical adenopathy.  Skin:    General: Skin is warm and dry.  Neurological:     General: No focal deficit present.     Mental Status: She is alert and oriented to person, place, and time.  Psychiatric:        Mood and Affect: Mood normal.        Behavior: Behavior normal.      UC Treatments / Results  Labs (all labs ordered are listed, but only abnormal results are displayed) Labs Reviewed - No data to display  EKG   Radiology No results found.  Procedures Procedures (including critical care time)  Medications Ordered in UC Medications  ipratropium-albuterol (DUONEB) 0.5-2.5 (3) MG/3ML nebulizer solution 3 mL (3 mLs Nebulization Given 12/04/22 1119)  ondansetron (ZOFRAN-ODT) disintegrating tablet 4 mg (4 mg Oral Given 12/04/22 1119)    Initial Impression / Assessment and Plan / UC Course  I have reviewed the triage vital signs and the nursing notes.  Pertinent labs & imaging results that were available during my care of the patient were reviewed by me and considered in my medical decision making (see chart for details).     Reviewed exam and symptoms with patient.  No red flags.  Patient given DuoNeb in clinic for wheezing.  After breathing treatment symptoms resolved and patient reports improvement Zofran in clinic for nausea and Rx sent to pharmacy Start Amoxil for left OM Prednisone for asthma flare Continue albuterol inhaler as needed Rest and fluids PCP follow-up if symptoms do not improve ER precautions reviewed and patient verbalized understanding Final Clinical Impressions(s) / UC Diagnoses   Final diagnoses:  Wheezing  Nausea  Mild intermittent asthma with acute  exacerbation  Recurrent acute suppurative otitis media without spontaneous rupture of left tympanic membrane     Discharge Instructions      Start amoxicillin for left ear infection Zofran as needed for nausea and vomiting Prednisone daily for 5 days for your asthma symptoms/wheezing.  You may continue albuterol inhaler as needed.  Rest and fluids.  Please follow-up with your PCP in 2 days for recheck Please go to the ER if you develop any worsening symptoms     ED Prescriptions     Medication Sig Dispense Auth. Provider   ondansetron (ZOFRAN-ODT) 4 MG disintegrating tablet Take 1 tablet (4 mg total) by mouth every 8 (eight) hours as needed for nausea or vomiting. 10 tablet Radford Pax, NP   amoxicillin (AMOXIL) 875 MG tablet Take 1 tablet (875 mg total) by mouth 2 (two) times daily for 10 days. 20 tablet Radford Pax, NP   predniSONE (DELTASONE) 20 MG tablet Take 2 tablets (40 mg total) by mouth daily with breakfast for 5 days. 10 tablet Radford Pax, NP      PDMP not reviewed this encounter.   Radford Pax, NP 12/04/22 1140

## 2022-12-04 NOTE — Discharge Instructions (Addendum)
Start amoxicillin for left ear infection Zofran as needed for nausea and vomiting Prednisone daily for 5 days for your asthma symptoms/wheezing.  You may continue albuterol inhaler as needed.  Rest and fluids.  Please follow-up with your PCP in 2 days for recheck Please go to the ER if you develop any worsening symptoms

## 2022-12-04 NOTE — ED Triage Notes (Signed)
Pt presents with c/o bilateral ear pain x 4 days. Started vomiting last night. Has taken mucinex, allergy medicine and vitamin C and ibuprofen for ear pain relief.

## 2023-04-05 IMAGING — US US MFM OB FOLLOW-UP
1 series · 13 of 28 positions shown · non-contrast
Comparison: none

[Series 1: us mfm ob follow-up · 13 of 44 slices shown]
[im 2/44]
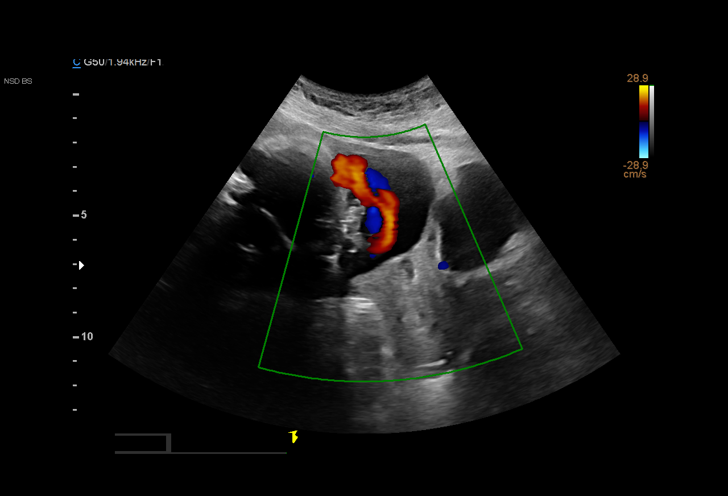
[im 5/44]
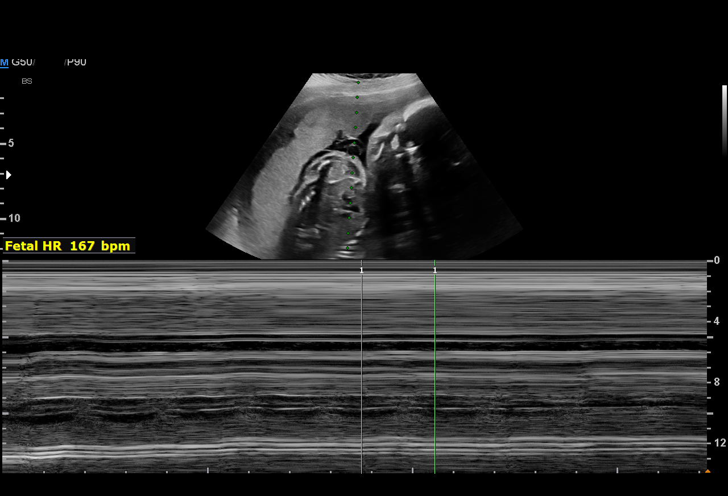
[im 8/44]
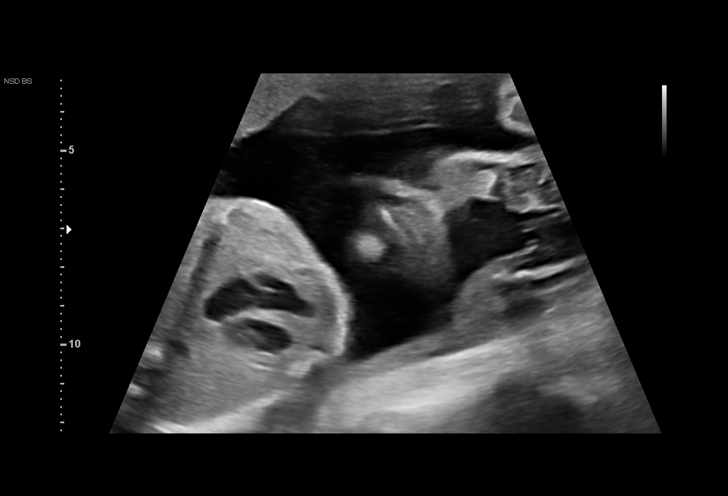
[im 12/44]
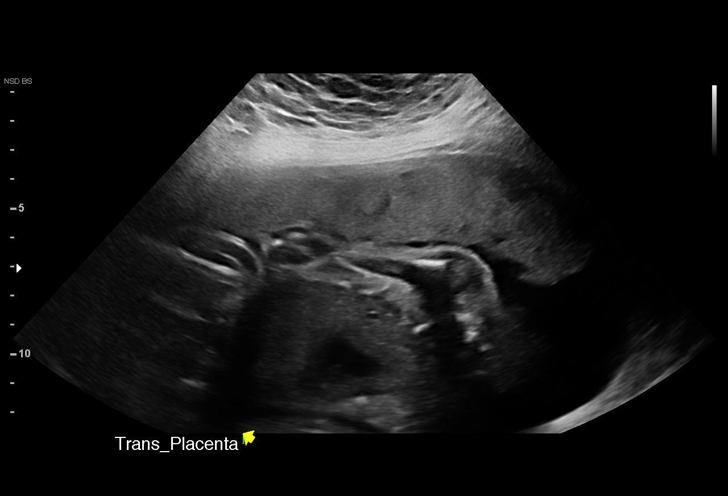
[im 15/44]
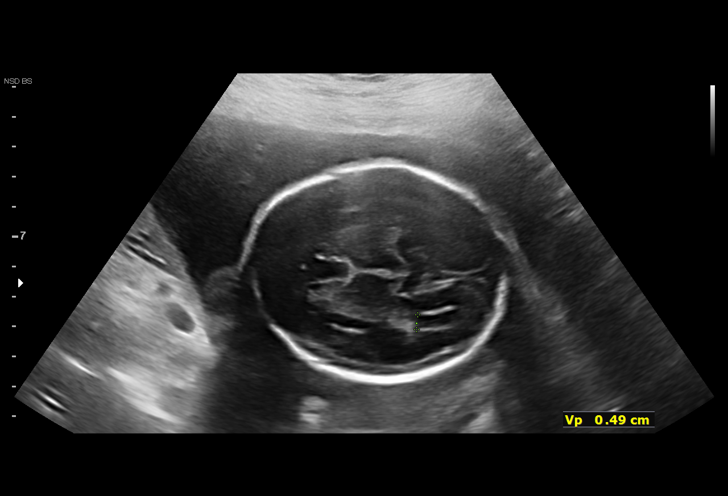
[im 18/44]
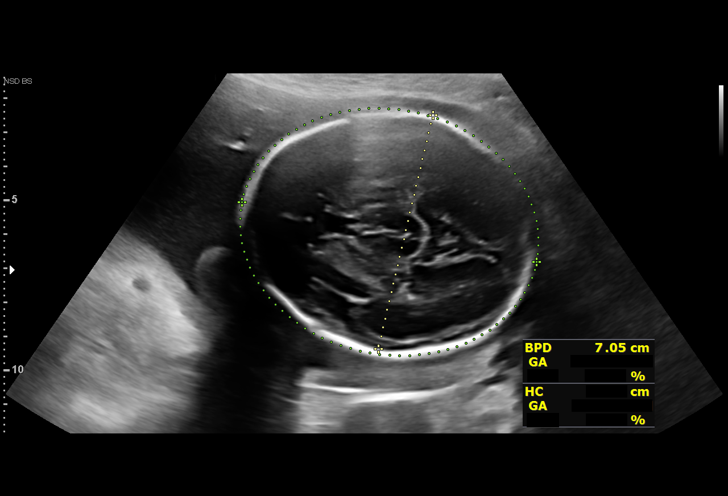
[im 23/44]
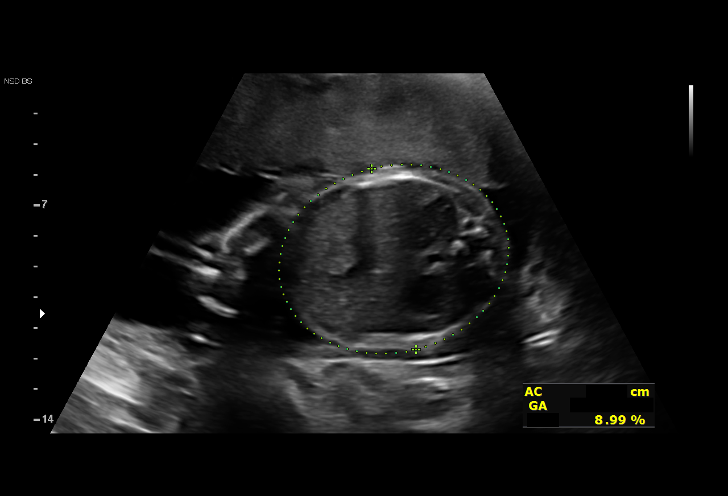
[im 26/44]
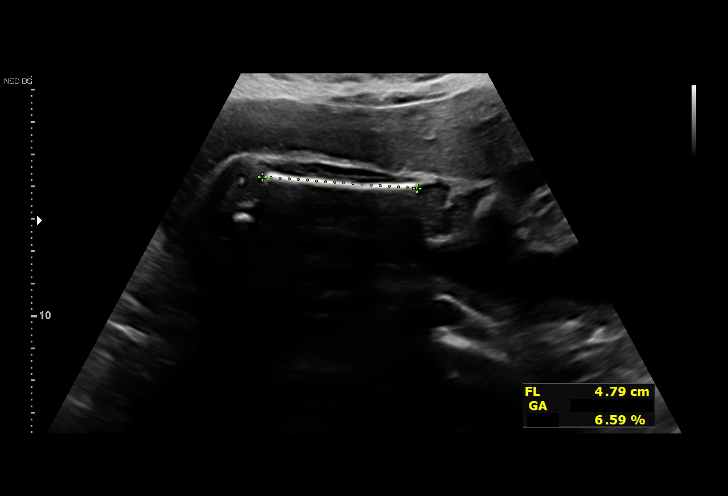
[im 29/44]
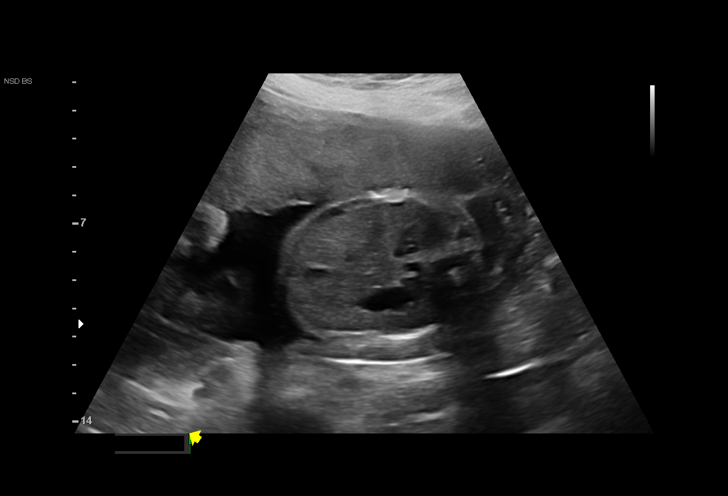
[im 32/44]
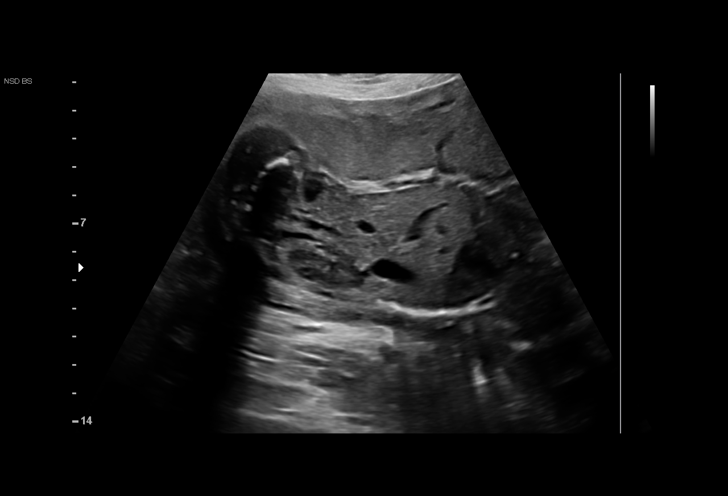
[im 36/44]
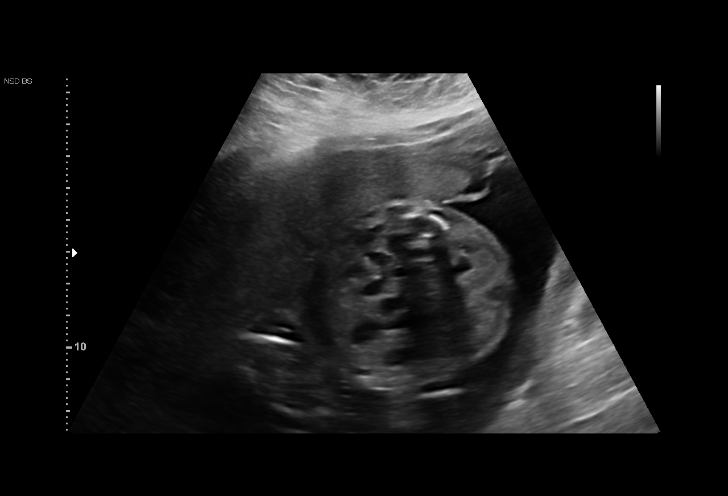
[im 39/44]
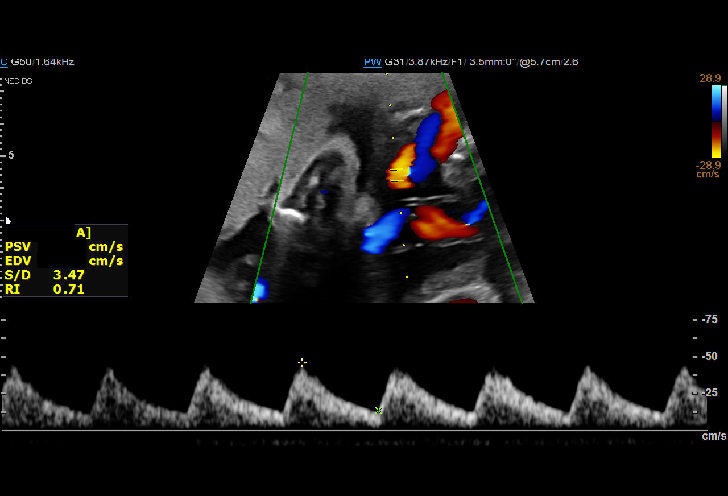
[im 42/44]
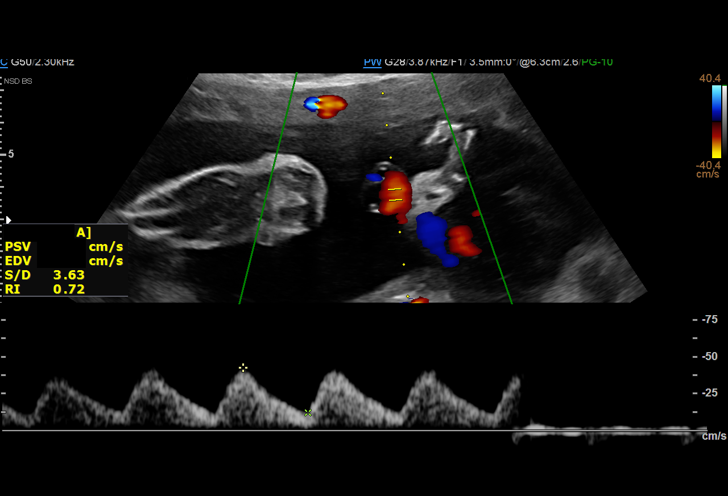

[13 of 28 positions shown; findings below may reference images not displayed]

Quirijn
                   WILLI JEUDY

Indications

 Obesity complicating pregnancy, second
 trimester (BMI:46)
 27 weeks gestation of pregnancy
 Antenatal follow-up for nonvisualized fetal
 anatomy
 Low Risk NIPS
 Maternal care for known or suspected poor
 fetal growth, third trimester, not applicable or
 unspecified IUGR
Fetal Evaluation

 Num Of Fetuses:         1
 Fetal Heart Rate(bpm):  167
 Cardiac Activity:       Observed
 Fetal Lie:              Cephalic
 Presentation:           Funic
 Placenta:               Anterior
 P. Cord Insertion:      Previously Visualized

 Amniotic Fluid
 AFI FV:      Within normal limits

                             Largest Pocket(cm)

Biometry

 BPD:      70.8  mm     G. Age:  28w 3d         72  %    CI:        77.28   %    70 - 86
                                                         FL/HC:      18.8   %    18.6 -
 HC:       255   mm     G. Age:  27w 5d         29  %    HC/AC:      1.18        1.05 -
 AC:      216.1  mm     G. Age:  26w 1d         10  %    FL/BPD:     67.8   %    71 - 87
 FL:         48  mm     G. Age:  26w 1d          7  %    FL/AC:      22.2   %    20 - 24
 HUM:      43.5  mm     G. Age:  25w 6d         12  %
 LV:        4.9  mm

 Est. FW:     927  gm      2 lb 1 oz      9  %
Gestational Age

 Clinical EDD:  27w 3d                                        EDD:   01/14/21
 U/S Today:     27w 1d                                        EDD:   01/16/21
 Best:          27w 3d     Det. By:  Early Ultrasound         EDD:   01/14/21
                                     (07/25/20)
Anatomy

 Cranium:               Appears normal         LVOT:                   Previously seen
 Cavum:                 Previously seen        Aortic Arch:            Previously seen
 Ventricles:            Appears normal         Ductal Arch:            Previously seen
 Choroid Plexus:        Previously seen        Diaphragm:              Appears normal
 Cerebellum:            Previously seen        Stomach:                Appears normal, left
                                                                       sided
 Posterior Fossa:       Previously seen        Abdomen:                Appears normal
 Nuchal Fold:           Previously seen        Abdominal Wall:         Previously seen
 Face:                  Orbits and profile     Cord Vessels:           Previously seen
                        previously seen
 Lips:                  Appears normal         Kidneys:                Appear normal
 Palate:                Not well visualized    Bladder:                Appears normal
 Thoracic:              Appears normal         Spine:                  Previously seen
 Heart:                 Appears normal         Upper Extremities:      Previously seen
                        (4CH, axis, and
                        situs)
 RVOT:                  Previously seen        Lower Extremities:      Previously seen

 Other:  Male gender previously seen.Technically difficult due to maternal
         habitus and fetal position.
Doppler - Fetal Vessels

 Umbilical Artery
  S/D     %tile      RI    %tile                             ADFV    RDFV
   3.3       61     0.7       67                                No      No

Cervix Uterus Adnexa

 Cervix
 Length:           3.63  cm.
 Normal appearance by transabdominal scan.
Impression

 Follow up growth due to elevated BMI and to clear unseen
 fetal anatomy.
 Normal interval growth with measurements suggestive of fetal
 growth restriction with EFW at the 9th% with AC 10th%.
 Good fetal movement and amniotic fluid volume
 NST today
 UA Dopplers are normal without evidence of AEDF or REDF.

 I discussed today's visit with a diagnosis of FGR. I explained
 that the etiology includes placental insufficiency, chronic
 disease, infection, aneuploidy and other genetic syndromes.
 She has a low risk NIPS. She has no additional risk factors
 for chronic disease. At this time I explained the diagnosis,
 evaluation and management to include on going fetal growth
 and weekly antenatal testing to include UA Dopplers. If the
 EFW < 3rd% or abnormal testing, I recommend delivery at 37
 weeks otherwise if all is normal consider delivery at 39
 weeks.
Recommendations

 Follow up growth in 4 weeks
 UA Dopplers/NST in 2 weeks

## 2023-05-03 IMAGING — US US MFM OB FOLLOW-UP
1 series · 13 of 28 positions shown · non-contrast
Comparison: none

[Series 1: us mfm ob follow-up · 68 acquisitions, 13 frames shown]
[im 3/68]
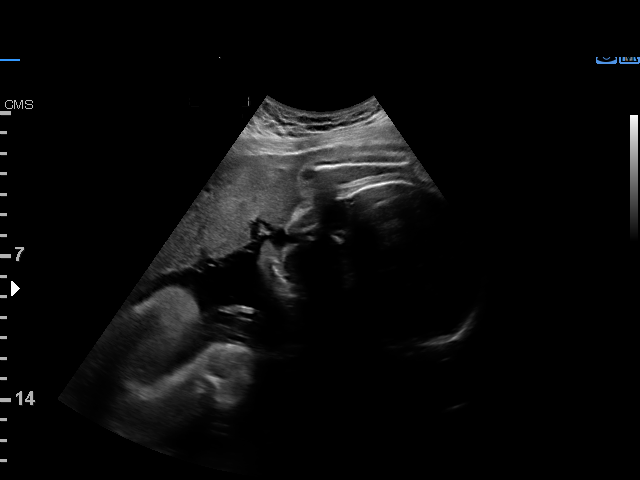
[im 8/68]
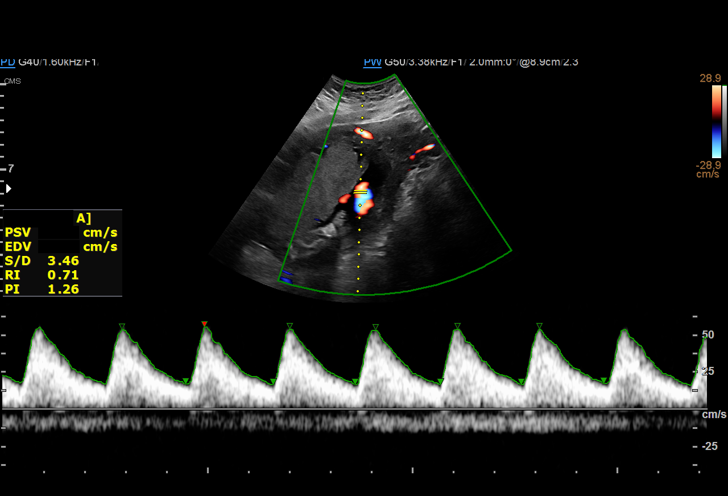
[im 13/68]
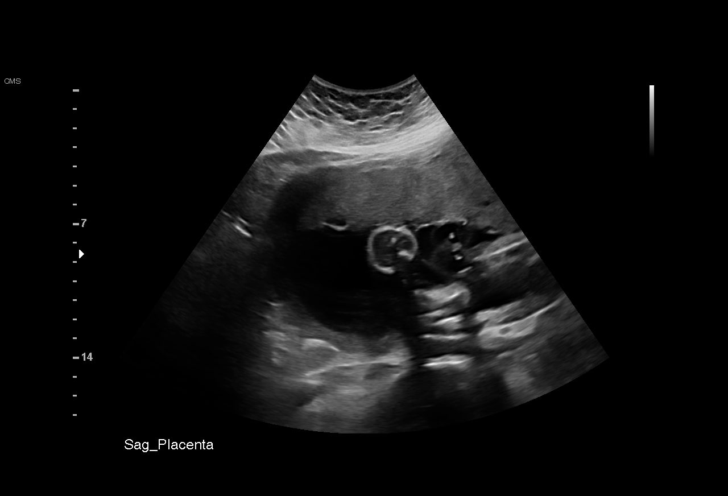
[im 18/68]
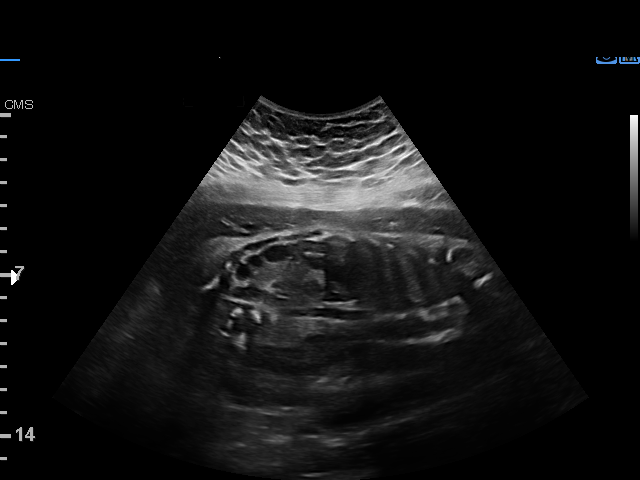
[im 23/68]
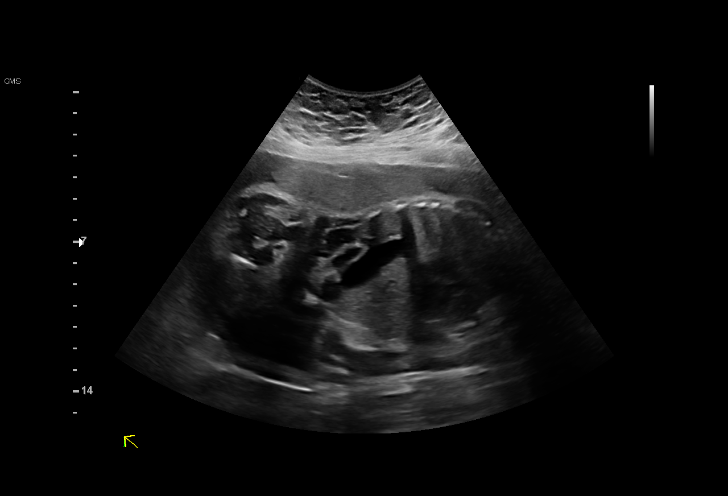
[im 28/68]
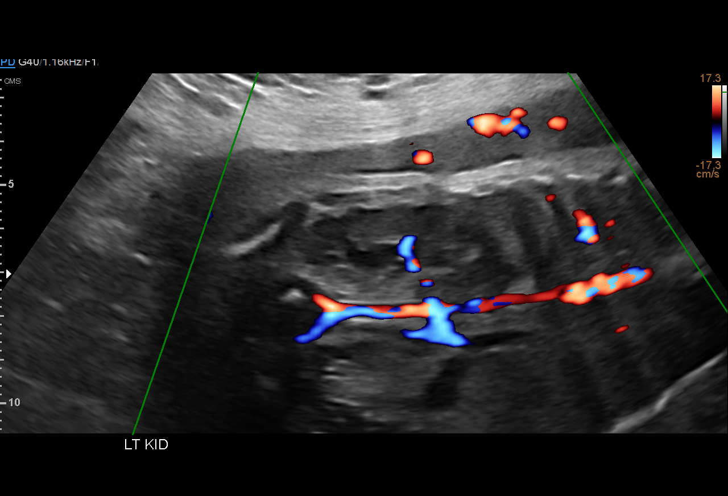
[im 35/68]
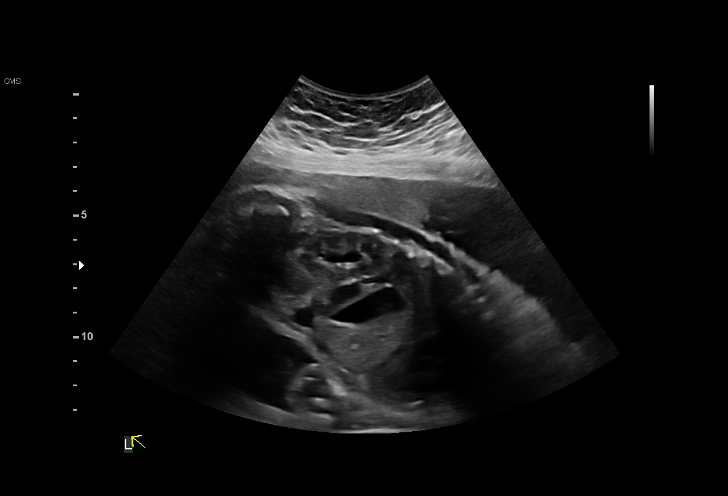
[im 40/68]
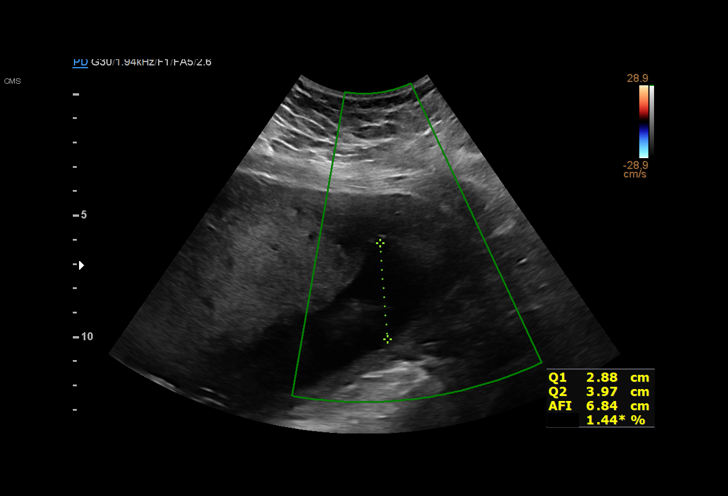
[im 45/68]
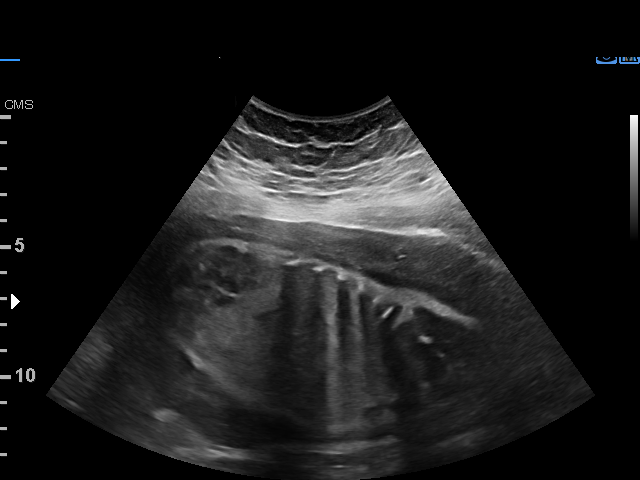
[im 50/68]
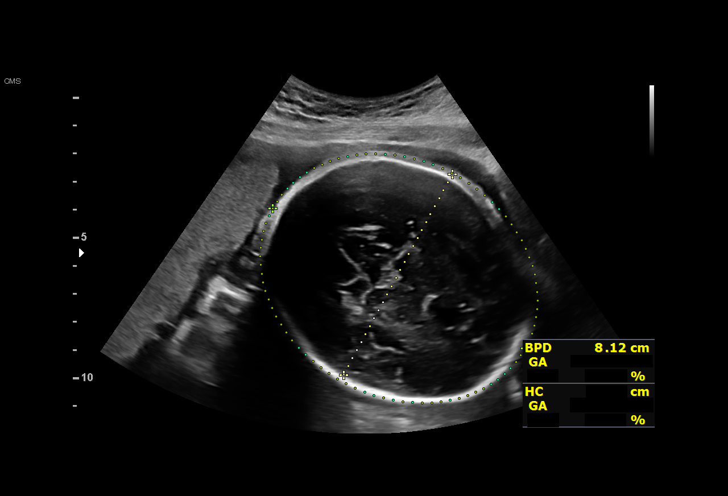
[im 55/68]
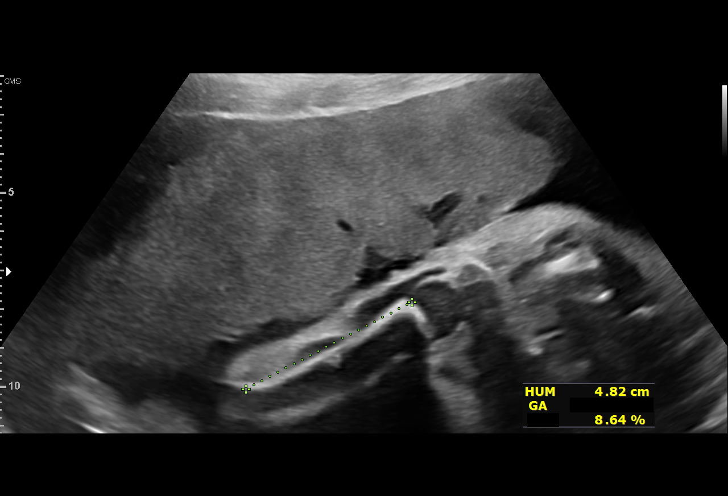
[im 60/68]
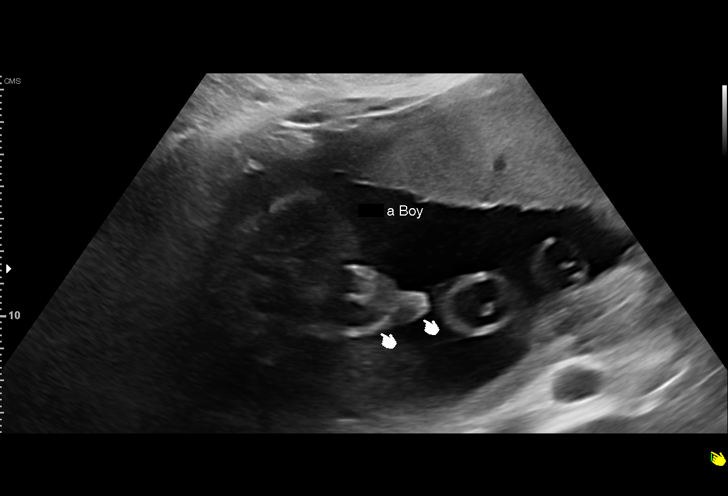
[im 65/68]
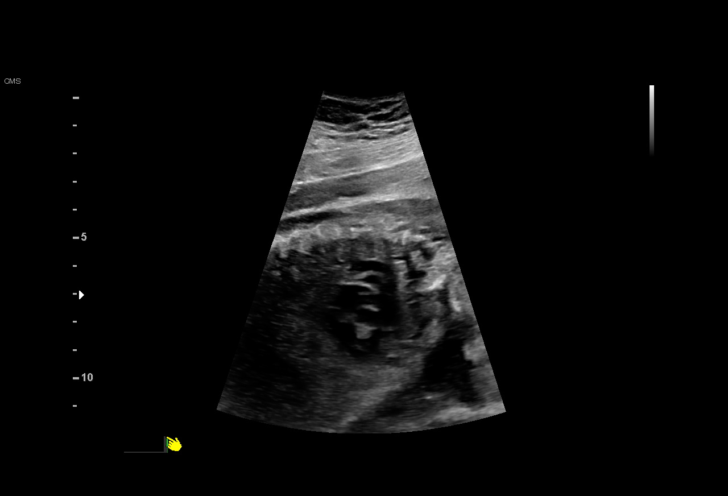

[13 of 28 positions shown; findings below may reference images not displayed]

Deeqa Rayaan
                   ANTIONE VH

                                                      PADAM
 2  US MFM UA CORD DOPPLER                76820.02    LAI
                                                      PADAM

Indications

 31 weeks gestation of pregnancy
 Maternal care for known or suspected poor
 fetal growth, third trimester, not applicable or
 unspecified IUGR
 Low Risk NIPS
 Obesity complicating pregnancy, third
 trimester (Pregravid BMI 46)
 Encounter for other antenatal screening
 follow-up
Fetal Evaluation

 Num Of Fetuses:         1
 Fetal Heart Rate(bpm):  166
 Cardiac Activity:       Observed
 Presentation:           Cephalic
 Placenta:               Anterior
 P. Cord Insertion:      Visualized

 Amniotic Fluid
 AFI FV:      Within normal limits

 AFI Sum(cm)     %Tile       Largest Pocket(cm)
 13.6            44
 RUQ(cm)       RLQ(cm)       LUQ(cm)        LLQ(cm)
 2.9           1.3           4
Biometry

 BPD:      80.7  mm     G. Age:  32w 3d         70  %    CI:        74.48   %    70 - 86
                                                         FL/HC:      18.3   %    19.3 -
 HC:      296.8  mm     G. Age:  32w 6d         52  %    HC/AC:      1.18        0.96 -
 AC:      252.3  mm     G. Age:  29w 3d        4.9  %    FL/BPD:     67.3   %    71 - 87
 FL:       54.3  mm     G. Age:  28w 5d        < 1  %    FL/AC:      21.5   %    20 - 24
 HUM:      51.5  mm     G. Age:  30w 1d         23  %

 Est. FW:    8888  gm      3 lb 3 oz    4.6  %
Gestational Age

 Clinical EDD:  31w 3d                                        EDD:   01/14/21
 U/S Today:     30w 6d                                        EDD:   01/18/21
 Best:          31w 3d     Det. By:  Early Ultrasound         EDD:   01/14/21
                                     (07/25/20)
Anatomy

 Cranium:               Appears normal         LVOT:                   Previously seen
 Cavum:                 Previously seen        Aortic Arch:            Appears normal
 Ventricles:            Appears normal         Ductal Arch:            Previously seen
 Choroid Plexus:        Previously seen        Diaphragm:              Previously seen
 Cerebellum:            Appears normal         Stomach:                Appears normal, left
                                                                       sided
 Posterior Fossa:       Previously seen        Abdomen:                Appears normal
 Nuchal Fold:           Previously seen        Abdominal Wall:         Previously seen
 Face:                  Orbits and profile     Cord Vessels:           Previously seen
                        previously seen
 Lips:                  Previously seen        Kidneys:                Appear normal
 Palate:                Not well visualized    Bladder:                Appears normal
 Thoracic:              Appears normal         Spine:                  Previously seen
 Heart:                 Previously seen        Upper Extremities:      Previously seen
 RVOT:                  Appears normal         Lower Extremities:      Previously seen

 Other:  Male gender previously seen.Technically difficult due to maternal
         habitus and fetal position.
Doppler - Fetal Vessels

 Umbilical Artery
  S/D     %tile      RI    %tile      PI    %tile     PSV    ADFV    RDFV
                                                    (cm/s)
  3.23       77    0.69       79    0.[REDACTED]      No      No

Cervix Uterus Adnexa

 Cervix
 Not visualized (advanced GA >53wks)

 Uterus
 No abnormality visualized.
 Right Ovary
 Within normal limits.

 Left Ovary
 Within normal limits.

 Cul De Sac
 No free fluid seen.

 Adnexa
 No abnormality visualized.
Impression

 Fetal growth restriction.  Patient returned for fetal growth
 assessment and antenatal testing.  Blood pressure today at
 her office is 119/56 mmHg.
 On today's ultrasound, the estimated fetal weight is at the 5th
 percentile (interval weight gain 517 g).  Abdominal
 circumference measurement is at the 5th percentile.
 Amniotic fluid is normal and good fetal activity seen.
 Umbilical artery Doppler showed normal forward diastolic
 flow.  NST is reactive.
 I explained the finding of fetal growth restriction and
 discussed ultrasound protocol for fetal monitoring.
Recommendations

 -Weekly BPP, UA Doppler to continue.
                 F[place]on, Adalid

## 2023-05-07 ENCOUNTER — Other Ambulatory Visit: Payer: Self-pay

## 2023-05-07 ENCOUNTER — Ambulatory Visit
Admission: EM | Admit: 2023-05-07 | Discharge: 2023-05-07 | Disposition: A | Discharge status: Home / Self Care | Payer: Medicaid Other | Attending: Internal Medicine | Admitting: Internal Medicine

## 2023-05-07 ENCOUNTER — Encounter: Payer: Self-pay | Admitting: *Deleted

## 2023-05-07 DIAGNOSIS — H6593 Unspecified nonsuppurative otitis media, bilateral: Secondary | ICD-10-CM

## 2023-05-07 MED ORDER — FLUTICASONE PROPIONATE 50 MCG/ACT NA SUSP: 1.0000 | Freq: Every day | NASAL | 0 refills | Status: AC

## 2023-05-07 NOTE — ED Triage Notes (Signed)
Pt reports 2 weeks of bilateral ear pain. States has a "wind sound" in left ear

## 2023-05-07 NOTE — Discharge Instructions (Signed)
You have fluid behind your eardrums so I have prescribed a nasal spray to take in addition to Zyrtec.  If symptoms persist or worsen, please follow-up for further evaluation and management.

## 2023-05-07 NOTE — ED Provider Notes (Signed)
[name]    CSN: [id number] Arrival date & time: 05/07/23  9528      History   Chief Complaint Chief Complaint  Patient presents with   Otalgia    HPI Alice Gibson is a 22 y.o. female.   Patient presents with bilateral ear discomfort that started about 2 weeks ago.  Reports that it feels like there is "wind in her ear".  Also reports intermittent pain and feelings of ear fullness.  Denies nasal congestion or runny nose.  Reports this is the first time she has been evaluated since symptoms started.  Denies any associated fever.  Has taken ibuprofen and Tylenol as well with minimal improvement.   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   Asthma    Asthma, mild persistent 03/12/2007   Qualifier: Diagnosis of  By: Humberto Seals NP, Darl Pikes      ASTHMA, PERSISTENT 03/12/2007   Qualifier: Diagnosis of  By: Humberto Seals NP, Susan      Back pain 05/18/2013   CHILDHOOD OBESITY 10/27/2008   Qualifier: Diagnosis of  By: McDiarmid MD, Todd     Constipation 10/05/2013   DERMATITIS, ATOPIC 12/18/2007   Qualifier: Diagnosis of  By: McDiarmid MD, Todd     Disordered sleep 12/02/2016   Disordered sleep 12/02/2016   Eczema 11/28/2015   Encounter for initial prescription of implantable subdermal contraceptive    Exercise-induced asthma 05/02/2016   Expressive language disorder 07/30/2010   Qualifier: Diagnosis of  By: McDiarmid MD, Dolores Patty MAL SEIZURE 08/22/2009    Qualifier: History of  By: McDiarmid MD, Todd     Group B streptococcal bacteriuria 08/20/2021   In Dec 2022, will need antibiotics in labor   History of atopic dermatitis 12/18/2007   History of atopic dermatitis as child.      History of atopic dermatitis 12/18/2007   History of atopic dermatitis as child.      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  Induced at 38 weeks 5 days, baby weighted 6 lbs 1 oz.    Scoliosis, adolescent acquired 09/11/2015   SEIZURE DISORDER, HX OF 04/02/2007   Qualifier: Diagnosis of  By: McDiarmid MD, Todd     Seizures [facility])    Severe episode of recurrent major depressive disorder, without psychotic features (HCC)    Smoking 07/21/2015   Superficial acne vulgaris 05/02/2016   Syncope 07/21/2015  Patient Active Problem List   Diagnosis Date Noted   Atopic dermatitis 05/02/2016   Exercise-induced asthma 05/02/2016   Morbid obesity (HCC) 10/27/2008    Past Surgical History:  Procedure Laterality Date   NO PAST SURGERIES      OB History     Gravida  4   Para  3   Term  2   Preterm      AB  1   Living  3      SAB  1   IAB      Ectopic      Multiple  0   Live Births  3            Home Medications    Prior to Admission medications   Medication Sig Start Date End Date Taking? Authorizing Provider  acetaminophen (TYLENOL) 500 MG tablet Take 2 tablets (1,000 mg total) by mouth  every 8 (eight) hours as needed (pain). 11/27/21  Yes Worthy Rancher, MD  albuterol (VENTOLIN HFA) 108 (90 Base) MCG/ACT inhaler Inhale 2 puffs into the lungs every 6 (six) hours as needed for wheezing or shortness of breath. 10/30/22  Yes Hagler, Arlys John, MD  cetirizine (ZYRTEC) 10 MG tablet Take 1 tablet (10 mg total) by mouth daily. 12/27/21  Yes McDiarmid, Leighton Roach, MD  fluticasone (FLONASE) 50 MCG/ACT nasal spray Place 1 spray into both nostrils daily. 05/07/23  Yes Hien Cunliffe, Rolly Salter E, FNP  triamcinolone cream (KENALOG) 0.1 % Apply 1 Application topically 2 (two) times daily. 05/30/22  Yes McDiarmid, Leighton Roach, MD  omeprazole (PRILOSEC) 20 MG capsule Take 1 capsule (20 mg total) by mouth daily. 09/02/22   Ganta, Anupa, DO  ondansetron (ZOFRAN-ODT) 4 MG disintegrating tablet Take 1 tablet (4 mg total) by mouth every 8 (eight) hours as needed for nausea or vomiting. 12/04/22   Radford Pax, NP    Family History Family History  Problem Relation Age of Onset   Alcohol abuse Mother 34   Drug abuse Mother 77   Mental illness Mother 46   Clotting disorder Mother 43       Hypercoagulopathy undefined but causing large aortic thromboemboli   Kidney disease Father    Diabetes Maternal Grandmother    Colon cancer Neg Hx    Esophageal cancer Neg Hx    Pancreatic cancer Neg Hx    Stomach cancer Neg Hx     Social History Social History   Tobacco Use   Smoking status: Former    Current packs/day: 0.00    Types: Cigarettes    Quit date: 08/20/2016    Years since quitting: 6.7   Smokeless tobacco: Never  Vaping Use   Vaping status: Never Used  Substance Use Topics   Alcohol use: Yes    Comment: occ   Drug use: Not Currently     Allergies   Valproic acid   Review of Systems Review of Systems Per HPI  Physical Exam Triage Vital Signs ED Triage Vitals  Encounter Vitals Group     BP 05/07/23 0833 117/69     Systolic BP Percentile --      Diastolic BP Percentile --      Pulse Rate  05/07/23 0833 73     Resp 05/07/23 0833 16     Temp 05/07/23 0833 98.3 F (36.8 C)     Temp Source 05/07/23 0833 Oral     SpO2 05/07/23 0833 97 %     Weight --  Height --      Head Circumference --      Peak Flow --      Pain Score 05/07/23 0830 4     Pain Loc --      Pain Education --      Exclude from Growth Chart --    No data found.  Updated Vital Signs BP 117/69 (BP Location: Right Wrist)   Pulse 73   Temp 98.3 F (36.8 C) (Oral)   Resp 16   SpO2 97%   Breastfeeding No   Visual Acuity Right Eye Distance:   Left Eye Distance:   Bilateral Distance:    Right Eye Near:   Left Eye Near:    Bilateral Near:     Physical Exam Constitutional:      General: She is not in acute distress.    Appearance: Normal appearance. She is not toxic-appearing or diaphoretic.  HENT:     Head: Normocephalic and atraumatic.     Right Ear: Ear canal and external ear normal. No drainage, swelling or tenderness. A middle ear effusion is present. There is no impacted cerumen. No mastoid tenderness. Tympanic membrane is not perforated, erythematous or bulging.     Left Ear: Ear canal and external ear normal. No drainage, swelling or tenderness. A middle ear effusion is present. There is no impacted cerumen. No mastoid tenderness. Tympanic membrane is not perforated, erythematous or bulging.  Eyes:     Extraocular Movements: Extraocular movements intact.     Conjunctiva/sclera: Conjunctivae normal.  Pulmonary:     Effort: Pulmonary effort is normal.  Neurological:     General: No focal deficit present.     Mental Status: She is alert and oriented to person, place, and time. Mental status is at baseline.  Psychiatric:        Mood and Affect: Mood normal.        Behavior: Behavior normal.        Thought Content: Thought content normal.        Judgment: Judgment normal.      UC Treatments / Results  Labs (all labs ordered are listed, but only abnormal results are displayed) Labs  Reviewed - No data to display  EKG   Radiology No results found.  Procedures Procedures (including critical care time)  Medications Ordered in UC Medications - No data to display  Initial Impression / Assessment and Plan / UC Course  I have reviewed the triage vital signs and the nursing notes.  Pertinent labs & imaging results that were available during my care of the patient were reviewed by me and considered in my medical decision making (see chart for details).     Patient has fluid behind TMs bilaterally.  Patient already taking cetirizine antihistamine so will add Flonase.  Advised to follow-up if ear symptoms persist or worsen.  Patient verbalized understanding and was agreeable with plan. Final Clinical Impressions(s) / UC Diagnoses   Final diagnoses:  Fluid level behind tympanic membrane of both ears     Discharge Instructions      You have fluid behind your eardrums so I have prescribed a nasal spray to take in addition to Zyrtec.  If symptoms persist or worsen, please follow-up for further evaluation and management.    ED Prescriptions     Medication Sig Dispense Auth. Provider   fluticasone (FLONASE) 50 MCG/ACT nasal spray Place 1 spray into both nostrils daily. 1 g Gustavus Bryant, [place]  PDMP not reviewed this encounter.   Gustavus Bryant, [place] 05/07/23 [phone]

## 2023-07-10 ENCOUNTER — Encounter: Payer: Self-pay | Admitting: Family Medicine

## 2023-07-10 ENCOUNTER — Ambulatory Visit (INDEPENDENT_AMBULATORY_CARE_PROVIDER_SITE_OTHER): Payer: Medicaid Other | Admitting: Family Medicine

## 2023-07-10 VITALS — BP 126/88 | HR 92 | Ht 59.0 in | Wt 269.2 lb

## 2023-07-10 DIAGNOSIS — H9203 Otalgia, bilateral: Secondary | ICD-10-CM | POA: Diagnosis not present

## 2023-07-10 DIAGNOSIS — L210 Seborrhea capitis: Secondary | ICD-10-CM

## 2023-07-10 DIAGNOSIS — L209 Atopic dermatitis, unspecified: Secondary | ICD-10-CM | POA: Diagnosis present

## 2023-07-10 NOTE — Progress Notes (Signed)
Alice Gibson is alone Sources of clinical information for visit is/are patient. Nursing assessment for this office visit was reviewed with the patient for accuracy and revision.     Previous Report(s) Reviewed: none     07/10/2023    1:47 PM  Depression screen PHQ 2/9  Decreased Interest 0  Down, Depressed, Hopeless 0  PHQ - 2 Score 0  Altered sleeping 0  Tired, decreased energy 1  Change in appetite 0  Feeling bad or failure about yourself  0  Trouble concentrating 0  Moving slowly or fidgety/restless 0  Suicidal thoughts 0  PHQ-9 Score 1   Flowsheet Row Office Visit from 07/10/2023 in [facility] - [facility] Of [place]. [facility] Office Visit from 09/02/2022 in [facility] - [facility] Of Eligha Bridegroom. [facility] Office Visit from 12/27/2021 in [facility] - [facility] Of [place]. [facility]  Thoughts that you would be better off dead, or of hurting yourself in some way Not at all Not at all Not at all  PHQ-9 Total Score 1 5 0          08/23/2021   10:16 AM 03/16/2021    3:56 PM 07/11/2020   11:22 AM 01/08/2018    2:52 PM 11/25/2017    1:56 PM  Fall Risk   Falls in the past year? 0 0 0 Yes Yes  Number falls in past yr: 0 0 0    Injury with Fall? 0 0 0 Yes Yes       07/10/2023    1:47 PM 09/02/2022    1:38 PM 12/27/2021   10:11 AM  PHQ9 SCORE ONLY  PHQ-9 Total Score 1 5 0    There are no preventive care reminders to display for this patient.  Health Maintenance Due  Topic Date Due   CHLAMYDIA SCREENING  11/15/2022   INFLUENZA VACCINE  02/13/2023   COVID-19 Vaccine (5 - 2024-25 season) 03/16/2023      History/P.E. limitations: none  There are no preventive care reminders to display for this patient. There are no preventive care reminders to display for this patient.  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  Red scalp areas at various areas of hairline. No other skin lesions.   Exam Primary Lesion (Macule/Nodule/Papule/Vesicule/Pustule/Wheal/Petechia) Patches at nape of neck scalp  hairline, hairline in retroauricular left of scalp, patches in retroauricular skin bilaterally, L>R Lesion Color (Skin/Red/White/Brown-Black/Yellow) erythema Secondary Lesion (Crust/Scale/Fissure/Erosion/Ulceration/Excoriation/Atrophy/Lichenified/Scar- Striae-Keloid) no Special Lesion (Comedome/Milia/Telangiectasia/Burrows) No raised plaques, no scaling.  Blushing (Yes/No): yes Topology (Flat-topped/Domed/Filiform/Pedunculated/Smooth/Verrucous/Umbilicated) Patches without secondary findings.   A/ Pruritic erythematous patches at scalp hairlines: Working diagnosis is seborrhea capitis.  Atopic dermatitis is possible given patient's personal history of atopia but no eczematous skin changes I would expect of this condition. Less likely psoriasis (not raised or scaling, Tinea capitis - no loss of hair or signs hair breakage,   P/ Ketoconazole 2% shampoo daily for one week than  once a week maintenance.    Otalgia History of ear pain complaint 05/07/23 UC visit with bilateral ear pain ==> diagnosis with fluid level behind tympanic membrane of both ears . Tx'd with Flonase which patient reports did not help.  11/2022 UC visit ear pain ==> diagnosis with acute suppurative otitis media treatment's amoxicillin.  12/2021 UC visit left ear pain +==> Left otitis media  with effusion tx'd with Amoxicillin  05/2021 [facility] office visit ear pain ==> Bilateral serous otitis media tx'd Flonase Several other similar ear pain visits since at least 2017.  HEENT: No TTP tragus or manipulation of pinna wither side.   Mild lichenification of ear concha bilaterally No erythema of lateral EAC either side Retracted TMs bilaterally  A/ Working diagnosis: Eustation tube dysfunction. Atypical GERD symptoms is possible, though the TM retraction would not necessarily fit this cause.  P/ Unfortunately, Ms Whitton is report inability to perform successful Valsalva maneuvers.     Emphasized using the two sprays each nostril daily of flonase regularly.  It appears that Ms Cortex has been prescribed Astelin-Flonase combo in past which may help if there is a vasomotor rhinitis component (though no complaint of PND or rhinorrhea.       Should this chronic complaint continue, consultation with ENT would be appropriate.

## 2023-07-10 NOTE — Patient Instructions (Signed)
Start ketoconazole 2% shampoo:  Shampoo scalp once a day for 7 days, then once a week.    Acetic acid-hydrocortisone ear drop for itching ears.  Put 4 drops into itch ear three times a day when ear is itching.  Continue up to 7 days if needed.

## 2023-07-11 ENCOUNTER — Telehealth: Payer: Self-pay

## 2023-07-11 DIAGNOSIS — L209 Atopic dermatitis, unspecified: Secondary | ICD-10-CM

## 2023-07-11 DIAGNOSIS — L210 Seborrhea capitis: Secondary | ICD-10-CM | POA: Insufficient documentation

## 2023-07-11 DIAGNOSIS — H9209 Otalgia, unspecified ear: Secondary | ICD-10-CM | POA: Insufficient documentation

## 2023-07-11 MED ORDER — HYDROCORTISONE-ACETIC ACID 1-2 % OT SOLN
4.0000 [drp] | Freq: Three times a day (TID) | OTIC | 99 refills | Status: AC | PRN
Start: 2023-07-11 — End: ?

## 2023-07-11 MED ORDER — KETOCONAZOLE 2 % EX SHAM
1.0000 | MEDICATED_SHAMPOO | CUTANEOUS | 99 refills | Status: AC
Start: 2023-07-14 — End: ?

## 2023-07-11 MED ORDER — NEOMYCIN-POLYMYXIN-HC 3.5-10000-1 OT SOLN
4.0000 [drp] | Freq: Four times a day (QID) | OTIC | 2 refills | Status: AC
Start: 2023-07-11 — End: ?

## 2023-07-11 NOTE — Assessment & Plan Note (Signed)
New complaint Present for couple years on and off Has not tried anything for it Itches frequently.  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  Red scalp areas at various areas of hairline. No other skin lesions.   Exam Primary Lesion (Macule/Nodule/Papule/Vesicule/Pustule/Wheal/Petechia) Patches at nape of neck scalp  hairline, hairline in retroauricular left of scalp, patches in retroauricular skin bilaterally, L>R Lesion Color (Skin/Red/White/Brown-Black/Yellow) erythema Secondary Lesion (Crust/Scale/Fissure/Erosion/Ulceration/Excoriation/Atrophy/Lichenified/Scar- Striae-Keloid) no Special Lesion (Comedome/Milia/Telangiectasia/Burrows) No raised plaques, no scaling.  Blushing (Yes/No): yes Topology (Flat-topped/Domed/Filiform/Pedunculated/Smooth/Verrucous/Umbilicated) Patches without secondary findings.   A/ Pruritic erythematous patches at scalp hairlines: Working diagnosis is seborrhea capitis.  Atopic dermatitis is possible given patient's personal history of atopia but no eczematous skin changes I would expect of this condition. Less likely psoriasis (not raised or scaling, Tinea capitis - no loss of hair or signs hair breakage,   P/ Ketoconazole 2% shampoo daily for one week than once a week maintenance.

## 2023-07-11 NOTE — Telephone Encounter (Signed)
Rec'd PA request for patients Hydrocortisone-Acetic Acid 1-2% solution.  Medicaid prefers acetic acid solution (generic for Vosol).  Could this be sent in instead?

## 2023-07-11 NOTE — Telephone Encounter (Signed)
Change Vosol-HC to cortisporin so patient gets the topical steroid for her atopic eczema of EAC

## 2023-07-11 NOTE — Assessment & Plan Note (Signed)
History of ear pain complaint 05/07/23 UC visit with bilateral ear pain ==> diagnosis with fluid level behind tympanic membrane of both ears . Tx'd with Flonase which patient reports did not help.  11/2022 UC visit ear pain ==> diagnosis with acute suppurative otitis media treatment's amoxicillin.  12/2021 UC visit left ear pain +==> Left otitis media with effusion tx'd with Amoxicillin  05/2021 [facility] office visit ear pain ==> Bilateral serous otitis media tx'd Flonase Several other similar ear pain visits since at least 2017.  HEENT: No TTP tragus or manipulation of pinna wither side.   Mild lichenification of ear concha bilaterally No erythema of lateral EAC either side Retracted TMs bilaterally  A/ Working diagnosis: Eustation tube dysfunction. Atypical GERD symptoms is possible, though the TM retraction would not necessarily fit this cause.  P/ Unfortunately, Alice Gibson is report inability to perform successful Valsalva maneuvers.     Emphasized using the two sprays each nostril daily of flonase regularly.  It appears that Alice Gibson has been prescribed Astelin-Flonase combo in past which may help if there is a vasomotor rhinitis component (though no complaint of PND or rhinorrhea.       Should this chronic complaint continue, consultation with ENT would be appropriate.

## 2023-08-18 ENCOUNTER — Ambulatory Visit
Admission: RE | Admit: 2023-08-18 | Discharge: 2023-08-18 | Disposition: A | Discharge status: Home / Self Care | Payer: Medicaid Other | Source: Ambulatory Visit | Attending: Family Medicine | Admitting: Family Medicine

## 2023-08-18 ENCOUNTER — Other Ambulatory Visit: Payer: Self-pay

## 2023-08-18 VITALS — BP 110/75 | HR 77 | Temp 98.8°F | Resp 20

## 2023-08-18 DIAGNOSIS — R197 Diarrhea, unspecified: Secondary | ICD-10-CM | POA: Diagnosis not present

## 2023-08-18 NOTE — Discharge Instructions (Signed)
Dietary changes for diarrhea

## 2023-08-18 NOTE — ED Provider Notes (Signed)
[facility]    CSN: [id number] Arrival date & time: 08/18/23  4098      History   Chief Complaint Chief Complaint  Patient presents with   Diarrhea    Entered by patient    HPI Alice [facility] is a 23 y.o. female.   The history is provided by the patient.  Diarrhea Had vomiting started 3 days ago lasted 2 days resolved then developed diarrhea 2 days ago which has persisted.  Has had approximately 4-5 episodes daily very watery without blood.  Taking Pepto-Bismol now stool looks black.  Son attends daycare was a stomach virus, he had similar symptoms 3 days ago. Admits abdominal pain 2 days ago which has resolved.  Tolerating p.o. fluids fine.  Has not made any dietary changes.  Denies recent travel, bad food exposure, fever, chills, body aches, fatigue.  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   Asthma    Asthma, mild persistent 03/12/2007   Qualifier: Diagnosis of  By: Humberto Seals NP, Darl Pikes      ASTHMA, PERSISTENT 03/12/2007   Qualifier: Diagnosis of  By: Humberto Seals NP, Susan      Back pain 05/18/2013   CHILDHOOD OBESITY 10/27/2008   Qualifier: Diagnosis of  By: McDiarmid MD, Todd     Constipation 10/05/2013   DERMATITIS, ATOPIC 12/18/2007   Qualifier: Diagnosis of  By: McDiarmid MD, Todd     Disordered sleep 12/02/2016   Eczema 11/28/2015   Encounter for initial prescription of implantable subdermal contraceptive    Exercise-induced asthma 05/02/2016   Expressive  language disorder 07/30/2010   Qualifier: Diagnosis of  By: McDiarmid MD, Dolores Patty MAL SEIZURE 08/22/2009   Qualifier: History of  By: McDiarmid MD, Todd     Group B streptococcal bacteriuria 08/20/2021   In Dec 2022, will need antibiotics in labor   History of atopic dermatitis 12/18/2007   History of atopic dermatitis as child.      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  Induced at 38 weeks 5 days, baby weighted 6 lbs 1 oz.    Scoliosis, adolescent acquired 09/11/2015   SEIZURE DISORDER, HX OF 04/02/2007   Qualifier: Diagnosis of  By: McDiarmid MD, Todd     Severe episode of recurrent major depressive disorder, without psychotic features (HCC)    Smoking 07/21/2015   Superficial acne vulgaris 05/02/2016   Syncope 07/21/2015    Patient Active Problem List   Diagnosis Date Noted  Seborrhea capitis in adult 07/11/2023   Otalgia 07/11/2023   Atopic dermatitis 05/02/2016   Exercise-induced asthma 05/02/2016   Morbid obesity (HCC) 10/27/2008    Past Surgical History:  Procedure Laterality Date   NO PAST SURGERIES      OB History     Gravida  4   Para  3   Term  2   Preterm      AB  1   Living  3      SAB  1   IAB      Ectopic      Multiple  0   Live Births  3            Home Medications    Prior to Admission medications   Medication Sig Start Date End Date Taking? Authorizing Provider  acetaminophen (TYLENOL) 500 MG tablet Take 2 tablets (1,000 mg total)  by mouth every 8 (eight) hours as needed (pain). 11/27/21   Worthy Rancher, MD  acetic acid-hydrocortisone (VOSOL-HC) OTIC solution Place 4 drops into both ears 3 (three) times daily as needed (itching ear). 07/11/23   McDiarmid, Leighton Roach, MD  albuterol (VENTOLIN HFA) 108 (90 Base) MCG/ACT inhaler Inhale 2 puffs into the lungs every 6 (six) hours as needed for wheezing or shortness of breath. 10/30/22   Mardella Layman, MD  cetirizine (ZYRTEC) 10 MG tablet Take 1 tablet (10 mg total) by mouth daily. 12/27/21   McDiarmid, Leighton Roach, MD  fluticasone (FLONASE) 50 MCG/ACT nasal spray Place 1 spray into both nostrils daily. 05/07/23   Gustavus Bryant, FNP  ketoconazole (NIZORAL) 2 % shampoo Apply 1 Application topically 2 (two) times a week. Shampoo once daily for 7 days then once a week to keep seborrhea from returning 07/14/23   McDiarmid, Leighton Roach, MD  neomycin-polymyxin-hydrocortisone (CORTISPORIN) OTIC solution Place 4 drops into both ears 4 (four) times daily. Start when ears itching.  Stop when itching resolves. 07/11/23   McDiarmid, Leighton Roach, MD  omeprazole (PRILOSEC) 20 MG capsule Take 1 capsule (20 mg total) by mouth daily. Patient not taking: Reported on 08/18/2023 09/02/22   Ganta, Anupa, DO  ondansetron (ZOFRAN-ODT) 4 MG disintegrating tablet Take 1 tablet (4 mg total) by mouth every 8 (eight) hours as needed for nausea or vomiting. Patient not taking: Reported on 08/18/2023 12/04/22   Radford Pax, NP  triamcinolone cream (KENALOG) 0.1 % Apply 1 Application topically 2 (two) times daily. 05/30/22   McDiarmid, Leighton Roach, MD    Family History Family History  Problem Relation Age of Onset   Alcohol abuse Mother 20   Drug abuse Mother 65   Mental illness Mother 43   Clotting disorder Mother 28       Hypercoagulopathy undefined but causing large aortic thromboemboli   Kidney disease Father    Diabetes Maternal Grandmother    Colon cancer Neg Hx    Esophageal cancer Neg Hx    Pancreatic cancer Neg Hx     Stomach cancer Neg Hx     Social History Social History   Tobacco Use   Smoking status: Former    Current packs/day: 0.00    Types: Cigarettes    Quit date: 08/20/2016    Years since quitting: 6.9   Smokeless tobacco: Never  Vaping Use   Vaping status: Never Used  Substance Use Topics   Alcohol use: Yes    Comment: occ   Drug use: Not Currently     Allergies  Valproic acid   Review of Systems Review of Systems  Gastrointestinal:  Positive for diarrhea.     Physical Exam Triage Vital Signs ED Triage Vitals  Encounter Vitals Group     BP 08/18/23 0940 110/75     Systolic BP Percentile --      Diastolic BP Percentile --      Pulse Rate 08/18/23 0940 77     Resp 08/18/23 0940 20     Temp 08/18/23 0940 98.8 F (37.1 C)     Temp Source 08/18/23 0940 Oral     SpO2 08/18/23 0940 97 %     Weight --      Height --      Head Circumference --      Peak Flow --      Pain Score 08/18/23 0937 0     Pain Loc --      Pain Education --      Exclude from Growth Chart --    No data found.  Updated Vital Signs BP 110/75 (BP Location: Right Arm) Comment (BP Location): large cuff  Pulse 77   Temp 98.8 F (37.1 C) (Oral)   Resp 20   LMP 07/28/2023   SpO2 97%   Visual Acuity Right Eye Distance:   Left Eye Distance:   Bilateral Distance:    Right Eye Near:   Left Eye Near:    Bilateral Near:     Physical Exam Vitals and nursing note reviewed.  Constitutional:      Appearance: She is not ill-appearing.  HENT:     Head: Normocephalic and atraumatic.     Right Ear: Tympanic membrane and ear canal normal.     Left Ear: Tympanic membrane and ear canal normal.     Nose: No rhinorrhea.     Mouth/Throat:     Mouth: Mucous membranes are moist.     Pharynx: Oropharynx is clear. No oropharyngeal exudate or posterior oropharyngeal erythema.  Eyes:     Conjunctiva/sclera: Conjunctivae normal.  Cardiovascular:     Rate and Rhythm: Normal rate and regular rhythm.      Heart sounds: Normal heart sounds.  Pulmonary:     Effort: Pulmonary effort is normal. No respiratory distress.     Breath sounds: Normal breath sounds. No wheezing, rhonchi or rales.  Abdominal:     General: Bowel sounds are normal.     Palpations: Abdomen is soft.     Tenderness: There is no abdominal tenderness. There is no guarding.  Musculoskeletal:     Cervical back: Neck supple.  Lymphadenopathy:     Cervical: No cervical adenopathy.  Neurological:     Mental Status: She is alert and oriented to person, place, and time.  Psychiatric:        Mood and Affect: Mood normal.      UC Treatments / Results  Labs (all labs ordered are listed, but only abnormal results are displayed) Labs Reviewed - No data to display  EKG   Radiology No results found.  Procedures Procedures (including critical care time)  Medications Ordered in UC Medications - No data to display  Initial Impression / Assessment and Plan / UC Course  I have reviewed the triage vital signs and the nursing notes.  Pertinent labs & imaging results that were available during my care of the patient were reviewed by me and considered in my medical decision making (see chart for details).     23 year old female exposed to child with  stomach bug having persistent diarrhea.  Well-appearing, well-hydrated, vital signs stable, abdominal exam is benign.  Home management of diarrhea reviewed with patient.  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   Meliton Rattan, [place] 08/18/23 [phone]

## 2023-08-18 NOTE — ED Triage Notes (Signed)
Reports vomiting on Friday, slight diarrhea.  Saturday had diarrhea, but no vomiting.  Patient 3 -4 episodes of diarrhea today, described as water-like.  Denies nausea or vomiting.    Patient has had pepto bismal and has been drinking sprite.  Has taken tylenol

## 2024-03-25 ENCOUNTER — Ambulatory Visit (INDEPENDENT_AMBULATORY_CARE_PROVIDER_SITE_OTHER)

## 2024-03-25 VITALS — BP 102/65 | HR 64 | Temp 98.3°F | Ht 59.0 in | Wt 267.0 lb

## 2024-03-25 DIAGNOSIS — Z3046 Encounter for surveillance of implantable subdermal contraceptive: Secondary | ICD-10-CM | POA: Diagnosis present

## 2024-03-25 NOTE — Progress Notes (Signed)
    SUBJECTIVE:   CHIEF COMPLAINT / HPI: Nexplanon  removal  Ms. Dworkin is a 23 YO female present at the [facility] today for Nexplanon  removal. Patient states she had a Nexplanon  placed in May 2023. Since then she has been experiencing upper arm pain, unable to lose weight, and HA. She would like Nexplanon  to be removed and reassess her symptoms. She denies SOB, Chest pain, or change in vision.   PERTINENT  PMH / PSH:  Morbid obesity  Exercise-induced asthma  Hx of seizure disorder Peanut Allergy     OBJECTIVE:   BP 102/65   Pulse 64   Temp 98.3 F (36.8 C)   Ht 4' 11 (1.499 m)   Wt 267 lb (121.1 kg)   SpO2 98%   BMI 53.93 kg/m   Physical Exam Cardiovascular:     Rate and Rhythm: Normal rate.     Pulses: Normal pulses.  Pulmonary:     Effort: Pulmonary effort is normal.     Breath sounds: Normal breath sounds.  Skin:    General: Skin is warm and dry.  Neurological:     Mental Status: She is oriented to person, place, and time.    Nexplanon  located in posterior Right Upper arm   ASSESSMENT/PLAN:  Ms. Skillman is a 23 YO female present at the [facility] today for Nexplanon  removal which was placed in May 2023.    Assessment & Plan Encounter for Nexplanon  removal Nexplanon  Removal  After verbal and written consent was obtained, patient was placed in supine position. The patient's right arm was flexed at the elbow and externally rotated so that the wrist was parallel to their ear. The device was palpated and marked. The area was cleansed with alcohol and 1% lidocaine  with epinephrine  was injected just under the distal end of the device. The site was cleaned with Betadine x3 and the area surrounding the device was covered with a sterile drape. A scalpel was used to create a small incision, and the device was pushed towards the incision. Fibrous tissue surrounding the device was gradually removed from the device. The device was grasped, removed and measured to ensure all 4 cm of device was  removed. 3-0 VICRYL  were used to close the incision. Pressure dressing was applied to the patient. The patient was instructed to remove the pressure dressing in 24 hours.   Patient states she will use condom and refused oral backup contraception. The patient denied any concerns or complaints and tolerated the procedure well.   No need for suture removal Follow up as needed   Houston Samuels, DO PGY -1 Family medicine resident [facility]

## 2024-03-25 NOTE — Patient Instructions (Signed)
   It was great to see you!  Our plans for today:  - Removed Nexplanon   Take care and seek immediate care sooner if you develop any concerns.       [facility] HAS PGY 1 Family Medicine Resident [facility]  [address] [place], [place] [address] Fax [phone] Phone [phone] 03/25/2024, 4:37 PM

## 2024-04-29 ENCOUNTER — Ambulatory Visit: Admitting: Family Medicine

## 2024-04-29 VITALS — BP 137/89 | HR 98 | Ht 59.0 in | Wt 267.0 lb

## 2024-04-29 DIAGNOSIS — R0789 Other chest pain: Secondary | ICD-10-CM

## 2024-04-29 NOTE — Progress Notes (Signed)
    SUBJECTIVE:   CHIEF COMPLAINT / HPI: lymph nodes  Discussed the use of AI scribe software for clinical note transcription with the patient, who gave verbal consent to proceed.  History of Present Illness Alice [facility] is a 23 year old female who presents with sudden, intermittent upper right back pain.  Upper back pain and paresthesia - Sudden onset, intermittent upper right back pain described as 'fire pain' - Pain triggered by certain movements, such as adjusting in bed or driving over potholes - Pain intensity reaches 10/10, lasting 30 seconds to 1 minute per episode - Associated with numbness and tingling in the same area, radiating to the side and front of the chest - No associated rash - No recent falls or injuries - No analgesic medications taken due to intermittent nature of symptoms  Infectious disease history - No history of shingles or chicken pox - Received chicken pox vaccine  Spinal abnormalities - Spinal x-ray in 2016 showed slight scoliosis    PERTINENT  PMH / PSH: Obesity, Exercise induced asthma  OBJECTIVE:   BP 137/89   Pulse 98   Ht 4' 11 (1.499 m)   Wt 267 lb (121.1 kg)   BMI 53.93 kg/m   Physical Exam General: NAD, well appearing Neuro: A&O Respiratory: normal WOB on RA Extremities: Moving all 4 extremities equally Chest: No obvious rash, erythema on the right lateral chest wall or back, mildly tender to palpation at the proximal mid body of the latissimus dorsi, no pain with resisted contraction of the latissimus dorsi, subjective numbness at that site, sensation intact throughout distal upper extremities, strength 5/5 pectoralis muscles   ASSESSMENT/PLAN:   Assessment & Plan Right-sided chest wall pain Most likely secondary to irritation of right sided 3rd-5th intercostal nerve.  Low suspicion for shingles. - Consider treatment for shingles if rash develops. - Consider obtaining thoracic spinal xray if no improvement over the next 2  weeks - Recommended to use topical lidocaine  patch over-the-counter once every 24 hours as needed - Tylenol  ibuprofen  as needed - Follow-up if not improving in the next 2 weeks  Return if symptoms worsen or fail to improve.  Ozell Provencal, MD, PGY-3 [facility] 4:45 PM 04/29/2024  [facility]

## 2024-04-29 NOTE — Patient Instructions (Signed)
 It was great to see you! Thank you for allowing me to participate in your care!  Our plans for today:   VISIT SUMMARY: Today, you were seen for sudden, intermittent upper right back pain that you described as a 'fire pain.' The pain is triggered by certain movements and is associated with numbness and tingling in the same area, radiating to the side and front of your chest.  YOUR PLAN: RIGHT UPPER BACK AND CHEST WALL PAIN WITH NUMBNESS AND TINGLING:  -If your symptoms persist or worsen more than 2 weeks, please call to reevaluate. -If there is no improvement in your symptoms, we may obtain a spinal x-ray to check for nerve involvement or structural issues. -Try deep breathing exercises, and take tylenol  or ibuprofen  as needed.    Please arrive 15 minutes PRIOR to your next scheduled appointment time! If you do not, this affects OTHER patients' care.  Take care and seek immediate care sooner if you develop any concerns.   Ozell Provencal, MD, PGY-3 [facility] 4:41 PM 04/29/2024  [facility]

## 2024-06-20 ENCOUNTER — Ambulatory Visit
Admission: EM | Admit: 2024-06-20 | Discharge: 2024-06-20 | Disposition: A | Discharge status: Home / Self Care | Attending: Internal Medicine | Admitting: Internal Medicine

## 2024-06-20 ENCOUNTER — Ambulatory Visit (INDEPENDENT_AMBULATORY_CARE_PROVIDER_SITE_OTHER)

## 2024-06-20 DIAGNOSIS — R0602 Shortness of breath: Secondary | ICD-10-CM | POA: Diagnosis not present

## 2024-06-20 DIAGNOSIS — R051 Acute cough: Secondary | ICD-10-CM

## 2024-06-20 DIAGNOSIS — J4521 Mild intermittent asthma with (acute) exacerbation: Secondary | ICD-10-CM

## 2024-06-20 DIAGNOSIS — J101 Influenza due to other identified influenza virus with other respiratory manifestations: Secondary | ICD-10-CM

## 2024-06-20 LAB — POC COVID19/FLU A&B COMBO
Covid Antigen, POC: NEGATIVE
Influenza A Antigen, POC: POSITIVE — AB
Influenza B Antigen, POC: NEGATIVE

## 2024-06-20 MED ORDER — PREDNISONE 20 MG PO TABS: 40.0000 mg | ORAL_TABLET | Freq: Every day | ORAL | 0 refills | Status: AC

## 2024-06-20 MED ORDER — ONDANSETRON 4 MG PO TBDP
4.0000 mg | ORAL_TABLET | Freq: Once | ORAL | Status: AC
  Administered 2024-06-20: 4 mg via ORAL

## 2024-06-20 MED ORDER — ONDANSETRON 4 MG PO TBDP: 4.0000 mg | ORAL_TABLET | Freq: Three times a day (TID) | ORAL | 0 refills | Status: AC | PRN

## 2024-06-20 MED ORDER — METHYLPREDNISOLONE SODIUM SUCC 125 MG IJ SOLR
80.0000 mg | Freq: Once | INTRAMUSCULAR | Status: AC
  Administered 2024-06-20: 80 mg via INTRAMUSCULAR

## 2024-06-20 MED ORDER — OSELTAMIVIR PHOSPHATE 75 MG PO CAPS: 75.0000 mg | ORAL_CAPSULE | Freq: Two times a day (BID) | ORAL | 0 refills | Status: AC

## 2024-06-20 MED ORDER — PROMETHAZINE-DM 6.25-15 MG/5ML PO SYRP: 5.0000 mL | ORAL_SOLUTION | Freq: Three times a day (TID) | ORAL | 0 refills | Status: AC | PRN

## 2024-06-20 NOTE — ED Provider Notes (Addendum)
 [name]    CSN: [id number] Arrival date & time: 06/20/24  0802      History   Chief Complaint Chief Complaint  Patient presents with   Fever   Trouble Breathing    HPI Alice Gibson is a 23 y.o. female.   24 year old female who presents urgent care with complaints of shortness of breath, cough, congestion, sore throat, body aches, headaches, fevers and chills.  Her symptoms started yesterday.  She reports the symptoms of gotten severe very quickly.  She does have a history of asthma and is having to use her asthma inhaler frequently.  She has been running fevers over 100.  She relates that her daughter did have influenza last week.  She overall has severe fatigue and no appetite.  She is staying hydrated.  She denies any vomiting, chest pain, abdominal pain, dysuria.   Fever Associated symptoms: chills, congestion, cough, headaches, myalgias and sore throat   Associated symptoms: no chest pain, no dysuria, no ear pain, no rash and no vomiting     Past Medical History:  Diagnosis Date   Abnormal urination 04/26/2014   Allergic rhinitis due to cats 05/02/2016   Allergic to animal dander 05/02/2016   Equivocal reactivity to Cat and to Dog danders on skin-prick testing by Dr Vinie (Allergist, 01/2008)   Allergy to mold 05/02/2016   Equivocal reactivity to mold on skin-prick testing by Dr Vinie (Allergist, 01/2008)   Allergy to pollen 05/02/2016   Strong allergic response to skin-prick testing ( Dr Vinie (Allergist) 01/2008) to tree pollens   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   Asthma    Asthma, mild persistent 03/12/2007   Qualifier: Diagnosis of  By: Gerlene NP, Devere      ASTHMA, PERSISTENT 03/12/2007   Qualifier: Diagnosis of  By: Gerlene NP, Susan      Back pain 05/18/2013   CHILDHOOD OBESITY 10/27/2008   Qualifier: Diagnosis of  By: McDiarmid MD, Todd     Constipation 10/05/2013   DERMATITIS, ATOPIC 12/18/2007   Qualifier: Diagnosis of  By:  McDiarmid MD, Todd     Disordered sleep 12/02/2016   Eczema 11/28/2015   Encounter for initial prescription of implantable subdermal contraceptive    Exercise-induced asthma 05/02/2016   Expressive language disorder 07/30/2010   Qualifier: Diagnosis of  By: McDiarmid MD, Krystal CHEEK MAL SEIZURE 08/22/2009   Qualifier: History of  By: McDiarmid MD, Todd     Group B streptococcal bacteriuria 08/20/2021   In Dec 2022, will need antibiotics in labor   History of atopic dermatitis 12/18/2007   History of atopic dermatitis as child.      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  Induced at 38 weeks 5 days, baby weighted 6 lbs 1 oz.    Scoliosis, adolescent acquired 09/11/2015   SEIZURE DISORDER, HX OF 04/02/2007   Qualifier: Diagnosis of  By: McDiarmid MD, Todd     Severe episode of recurrent major depressive  disorder, without psychotic features (HCC)    Smoking 07/21/2015   Superficial acne vulgaris 05/02/2016   Syncope 07/21/2015    Patient Active Problem List   Diagnosis Date Noted   Seborrhea capitis in adult 07/11/2023   Otalgia 07/11/2023   Atopic dermatitis 05/02/2016   Exercise-induced asthma 05/02/2016   Morbid obesity (HCC) 10/27/2008    Past Surgical History:  Procedure Laterality Date   NO PAST SURGERIES      OB History     Gravida  4   Para  3   Term  2   Preterm      AB  1   Living  3      SAB  1   IAB      Ectopic      Multiple  0   Live Births  3             Home Medications    Prior to Admission medications   Medication Sig Start Date End Date Taking? Authorizing Provider  albuterol  (VENTOLIN  HFA) 108 (90 Base) MCG/ACT inhaler Inhale 2 puffs into the lungs every 6 (six) hours as needed for wheezing or shortness of breath. 10/30/22  Yes Hagler, Redell, MD  ondansetron  (ZOFRAN -ODT) 4 MG disintegrating tablet Take 1 tablet (4 mg total) by mouth every 8 (eight) hours as needed for nausea or vomiting. 06/20/24  Yes Atzel Mccambridge A, PA-C  oseltamivir  (TAMIFLU ) 75 MG capsule Take 1 capsule (75 mg total) by mouth every 12 (twelve) hours. 06/20/24  Yes Sharnee Douglass A, PA-C  predniSONE  (DELTASONE ) 20 MG tablet Take 2 tablets (40 mg total) by mouth daily with breakfast for 5 days. 06/20/24 06/25/24 Yes Keir Foland A, PA-C  promethazine -dextromethorphan  (PROMETHAZINE -DM) 6.25-15 MG/5ML syrup Take 5 mLs by mouth every 8 (eight) hours as needed for cough. 06/20/24  Yes Alyrica Thurow A, PA-C  Pseudoeph-Doxylamine -DM-APAP (NYQUIL PO) Take by mouth.   Yes [provider]  acetaminophen  (TYLENOL ) 500 MG tablet Take 2 tablets (1,000 mg total) by mouth every 8 (eight) hours as needed (pain). 11/27/21   Clem Tawni HERO, MD  acetic acid -hydrocortisone  (VOSOL -HC) OTIC solution Place 4 drops into both ears 3 (three) times daily as needed (itching ear). 07/11/23   McDiarmid, Krystal BIRCH, MD  cetirizine  (ZYRTEC ) 10 MG tablet Take 1 tablet (10 mg total) by mouth daily. 12/27/21   McDiarmid, Krystal BIRCH, MD  fluticasone  (FLONASE ) 50 MCG/ACT nasal spray Place 1 spray into both nostrils daily. 05/07/23   Hazen Darryle BRAVO, FNP  ketoconazole  (NIZORAL ) 2 % shampoo Apply 1 Application topically 2 (two) times a week. Shampoo once daily for 7 days then once a week to keep seborrhea from returning 07/14/23   McDiarmid, Krystal BIRCH, MD  neomycin -polymyxin-hydrocortisone  (CORTISPORIN) OTIC solution Place 4 drops into both ears 4 (four) times daily. Start when ears itching.  Stop  when itching resolves. 07/11/23   McDiarmid, Krystal BIRCH, MD  omeprazole  (PRILOSEC) 20 MG capsule Take 1 capsule (20 mg total) by mouth daily. Patient not taking: Reported on 08/18/2023 09/02/22   Ganta, Anupa, DO  triamcinolone  cream (KENALOG ) 0.1 % Apply 1 Application topically 2 (two) times daily. 05/30/22   McDiarmid, Krystal BIRCH, MD    Family History Family History  Problem Relation Age of Onset   Alcohol abuse Mother 54   Drug abuse Mother 43   Mental illness Mother 75   Clotting disorder Mother 47       Hypercoagulopathy undefined but causing large aortic thromboemboli  Kidney disease Father    Diabetes Maternal Grandmother    Colon cancer Neg Hx    Esophageal cancer Neg Hx    Pancreatic cancer Neg Hx    Stomach cancer Neg Hx     Social History Social History   Tobacco Use   Smoking status: Former    Current packs/day: 0.00    Types: Cigarettes    Quit date: 08/20/2016    Years since quitting: 7.8   Smokeless tobacco: Never  Vaping Use   Vaping status: Never Used  Substance Use Topics   Alcohol use: Yes    Comment: occ   Drug use: Not Currently     Allergies   Valproic acid   Review of Systems Review of Systems  Constitutional:  Positive for chills, fatigue and fever.  HENT:  Positive for congestion and sore throat. Negative for ear pain.   Eyes:  Negative for pain and visual disturbance.  Respiratory:  Positive for cough, chest tightness and shortness of breath.   Cardiovascular:  Negative for chest pain and palpitations.  Gastrointestinal:  Negative for abdominal pain and vomiting.  Genitourinary:  Negative for dysuria and hematuria.  Musculoskeletal:  Positive for myalgias. Negative for arthralgias and back pain.  Skin:  Negative for color change and rash.  Neurological:  Positive for headaches. Negative for seizures and syncope.  All other systems reviewed and are negative.    Physical Exam Triage Vital Signs ED Triage Vitals  Encounter Vitals Group      BP 06/20/24 0815 138/88     Girls Systolic BP Percentile --      Girls Diastolic BP Percentile --      Boys Systolic BP Percentile --      Boys Diastolic BP Percentile --      Pulse Rate 06/20/24 0815 (!) 118     Resp 06/20/24 0815 20     Temp 06/20/24 0815 99.8 F (37.7 C)     Temp Source 06/20/24 0815 Oral     SpO2 06/20/24 0815 97 %     Weight 06/20/24 0813 260 lb (117.9 kg)     Height 06/20/24 0813 4' 11 (1.499 m)     Head Circumference --      Peak Flow --      Pain Score 06/20/24 0810 4     Pain Loc --      Pain Education --      Exclude from Growth Chart --    No data found.  Updated Vital Signs BP 138/88 (BP Location: Left Arm)   Pulse (!) 118   Temp 99.8 F (37.7 C) (Oral)   Resp 20   Ht 4' 11 (1.499 m)   Wt 260 lb (117.9 kg)   LMP 06/04/2024 (Approximate)   SpO2 97%   BMI 52.51 kg/m   Visual Acuity Right Eye Distance:   Left Eye Distance:   Bilateral Distance:    Right Eye Near:   Left Eye Near:    Bilateral Near:     Physical Exam Vitals and nursing note reviewed.  Constitutional:      General: She is not in acute distress.    Appearance: She is well-developed.  HENT:     Head: Normocephalic and atraumatic.     Right Ear: Tympanic membrane normal.     Left Ear: Tympanic membrane normal.     Nose: Congestion present.     Mouth/Throat:     Mouth: Mucous membranes are moist.  Pharynx: Posterior oropharyngeal erythema present.  Eyes:     Conjunctiva/sclera: Conjunctivae normal.  Cardiovascular:     Rate and Rhythm: Regular rhythm. Tachycardia present.     Heart sounds: No murmur heard. Pulmonary:     Effort: Pulmonary effort is normal. Tachypnea present. No respiratory distress.     Breath sounds: Examination of the right-upper field reveals wheezing. Examination of the left-upper field reveals wheezing. Examination of the right-middle field reveals wheezing. Examination of the left-middle field reveals wheezing. Examination of the  right-lower field reveals wheezing. Examination of the left-lower field reveals wheezing. Wheezing present.  Abdominal:     Palpations: Abdomen is soft.     Tenderness: There is no abdominal tenderness.  Musculoskeletal:        General: No swelling.     Cervical back: Neck supple.  Skin:    General: Skin is warm and dry.     Capillary Refill: Capillary refill takes less than 2 seconds.  Neurological:     Mental Status: She is alert.  Psychiatric:        Mood and Affect: Mood normal.      UC Treatments / Results  Labs (all labs ordered are listed, but only abnormal results are displayed) Labs Reviewed  POC COVID19/FLU A&B COMBO - Abnormal; Notable for the following components:      Result Value   Influenza A Antigen, POC Positive (*)    All other components within normal limits    EKG   Radiology DG Chest 2 View Result Date: 06/20/2024 EXAM: 2 VIEW(S) XRAY OF THE CHEST 06/20/2024 08:36:38 AM COMPARISON: 05/27/2014. CLINICAL HISTORY: SOA, cough, h/o asthma. FINDINGS: LUNGS AND PLEURA: No focal pulmonary opacity. No pleural effusion. No pneumothorax. HEART AND MEDIASTINUM: No acute abnormality of the cardiac and mediastinal silhouettes. BONES AND SOFT TISSUES: No acute osseous abnormality. IMPRESSION: 1. No acute cardiopulmonary process. Electronically signed by: Waddell Calk MD 06/20/2024 08:45 AM EST RP Workstation: [id number]    Procedures Procedures (including critical care time)  Medications Ordered in UC Medications  methylPREDNISolone  sodium succinate (SOLU-MEDROL ) 125 mg/2 mL injection 80 mg (80 mg Intramuscular Given 06/20/24 0840)  ondansetron  (ZOFRAN -ODT) disintegrating tablet 4 mg (4 mg Oral Given 06/20/24 0850)    Initial Impression / Assessment and Plan / UC Course  I have reviewed the triage vital signs and the nursing notes.  Pertinent labs & imaging results that were available during my care of the patient were reviewed by me and considered in my medical  decision making (see chart for details).     Influenza A  Shortness of breath - Plan: DG Chest 2 View, DG Chest 2 View  Acute cough - Plan: DG Chest 2 View, DG Chest 2 View  Mild intermittent asthma with acute exacerbation   Flu A, flu B and COVID testing done today.  Your test is positive for flu A and this is likely also causing an asthma exacerbation.  This is a viral infection and does not require antibiotics.  We will treat this with Tamiflu  and medication to help with the associated symptoms.  Chest x-ray done today.  Final evaluation by the radiologist does not show any acute findings.  We will treat with the following: Medrol  injection given today. This is a steroid to help with inflammation. Tamiflu  75 mg twice daily for 5 days. Start 06/21/24 Prednisone  40 mg (2 tablets) once daily for 5 days. Take this in the morning.  This is a steroid to help with inflammation.  Do not take ibuprofen  while you are taking this medication.  It is okay to take Tylenol . Promethazine  DM 5 mL every 8 hours as needed for cough.  Use caution as this medication can cause drowsiness.  Zofran  4 mg orally disintegrating tablet every 8 hours as needed for nausea.  Dose of Zofran  given in clinic today.  Next dose would be due around 5 PM tonight May return to work when you are 24 hours without a fever without fever reducing medications. Make sure to stay hydrated by drinking plenty of water. Return to urgent care or PCP if symptoms worsen or fail to resolve.   Final Clinical Impressions(s) / UC Diagnoses   Final diagnoses:  Shortness of breath  Acute cough  Influenza A  Mild intermittent asthma with acute exacerbation     Discharge Instructions      Flu A, flu B and COVID testing done today.  Your test is positive for flu A and this is likely also causing an asthma exacerbation.  This is a viral infection and does not require antibiotics.  We will treat this with Tamiflu  and medication to help with  the associated symptoms.  Chest x-ray done today.  Final evaluation by the radiologist does not show any acute findings.  We will treat with the following: Medrol  injection given today. This is a steroid to help with inflammation. Tamiflu  75 mg twice daily for 5 days. Start 06/21/24 Prednisone  40 mg (2 tablets) once daily for 5 days. Take this in the morning.  This is a steroid to help with inflammation.  Do not take ibuprofen  while you are taking this medication.  It is okay to take Tylenol . Promethazine  DM 5 mL every 8 hours as needed for cough.  Use caution as this medication can cause drowsiness.  Zofran  4 mg orally disintegrating tablet every 8 hours as needed for nausea.  Dose of Zofran  given in clinic today.  Next dose would be due around 5 PM tonight May return to work when you are 24 hours without a fever without fever reducing medications. Make sure to stay hydrated by drinking plenty of water. Return to urgent care or PCP if symptoms worsen or fail to resolve.       ED Prescriptions     Medication Sig Dispense Auth. Provider   oseltamivir  (TAMIFLU ) 75 MG capsule Take 1 capsule (75 mg total) by mouth every 12 (twelve) hours. 10 capsule Teresa Norris A, PA-C   predniSONE  (DELTASONE ) 20 MG tablet Take 2 tablets (40 mg total) by mouth daily with breakfast for 5 days. 10 tablet Shanavia Makela A, PA-C   promethazine -dextromethorphan  (PROMETHAZINE -DM) 6.25-15 MG/5ML syrup Take 5 mLs by mouth every 8 (eight) hours as needed for cough. 180 mL Philomena Buttermore A, PA-C   ondansetron  (ZOFRAN -ODT) 4 MG disintegrating tablet Take 1 tablet (4 mg total) by mouth every 8 (eight) hours as needed for nausea or vomiting. 20 tablet Teresa Norris LABOR, [place]      PDMP not reviewed this encounter.   Teresa Norris LABOR, PA-C 06/20/24 0850    Teresa Norris LABOR, PA-C 06/20/24 [phone]

## 2024-06-20 NOTE — ED Triage Notes (Signed)
 Symptoms started yesterday with a lot of mucous, cough and trouble breathing. During the day at work same symptoms & at night last night body aches, headache with Fever (up to 103) as well. Daughter was here last week for the Flu. History of Asthma (last used last night).

## 2024-06-20 NOTE — Discharge Instructions (Addendum)
 Flu A, flu B and COVID testing done today.  Your test is positive for flu A and this is likely also causing an asthma exacerbation.  This is a viral infection and does not require antibiotics.  We will treat this with Tamiflu  and medication to help with the associated symptoms.  Chest x-ray done today.  Final evaluation by the radiologist does not show any acute findings.  We will treat with the following: Medrol  injection given today. This is a steroid to help with inflammation. Tamiflu  75 mg twice daily for 5 days. Start 06/21/24 Prednisone  40 mg (2 tablets) once daily for 5 days. Take this in the morning.  This is a steroid to help with inflammation.  Do not take ibuprofen  while you are taking this medication.  It is okay to take Tylenol . Promethazine  DM 5 mL every 8 hours as needed for cough.  Use caution as this medication can cause drowsiness.  Zofran  4 mg orally disintegrating tablet every 8 hours as needed for nausea.  Dose of Zofran  given in clinic today.  Next dose would be due around 5 PM tonight May return to work when you are 24 hours without a fever without fever reducing medications. Make sure to stay hydrated by drinking plenty of water. Return to urgent care or PCP if symptoms worsen or fail to resolve.
